# Patient Record
Sex: Female | Born: 1960
Health system: Southern US, Community
[De-identification: ages and names within clinical notes are randomized; demographics above are authoritative.]

## PROBLEM LIST (undated history)

## (undated) DIAGNOSIS — K5909 Other constipation: Secondary | ICD-10-CM

## (undated) DIAGNOSIS — J310 Chronic rhinitis: Secondary | ICD-10-CM

## (undated) DIAGNOSIS — Z8542 Personal history of malignant neoplasm of other parts of uterus: Secondary | ICD-10-CM

## (undated) DIAGNOSIS — Z90710 Acquired absence of both cervix and uterus: Secondary | ICD-10-CM

## (undated) DIAGNOSIS — D169 Benign neoplasm of bone and articular cartilage, unspecified: Secondary | ICD-10-CM

## (undated) DIAGNOSIS — J302 Other seasonal allergic rhinitis: Secondary | ICD-10-CM

## (undated) DIAGNOSIS — C55 Malignant neoplasm of uterus, part unspecified: Secondary | ICD-10-CM

## (undated) DIAGNOSIS — D229 Melanocytic nevi, unspecified: Secondary | ICD-10-CM

## (undated) DIAGNOSIS — H409 Unspecified glaucoma: Secondary | ICD-10-CM

## (undated) DIAGNOSIS — G809 Cerebral palsy, unspecified: Secondary | ICD-10-CM

## (undated) DIAGNOSIS — D509 Iron deficiency anemia, unspecified: Secondary | ICD-10-CM

## (undated) DIAGNOSIS — E8941 Symptomatic postprocedural ovarian failure: Secondary | ICD-10-CM

## (undated) DIAGNOSIS — R072 Precordial pain: Secondary | ICD-10-CM

## (undated) DIAGNOSIS — K5900 Constipation, unspecified: Secondary | ICD-10-CM

## (undated) DIAGNOSIS — R0789 Other chest pain: Secondary | ICD-10-CM

## (undated) HISTORY — DX: Iron deficiency anemia, unspecified: D50.9

## (undated) HISTORY — DX: Other chest pain: R07.89

## (undated) HISTORY — DX: Personal history of malignant neoplasm of other parts of uterus: Z85.42

## (undated) HISTORY — DX: Malignant neoplasm of uterus, part unspecified: C55

## (undated) HISTORY — DX: Melanocytic nevi, unspecified: D22.9

## (undated) HISTORY — PX: FOOT SURGERY: SHX648

## (undated) HISTORY — DX: Chronic rhinitis: J31.0

## (undated) HISTORY — PX: SHOULDER ARTHROSCOPY WITH OPEN ROTATOR CUFF REPAIR: SHX6092

## (undated) HISTORY — DX: Cerebral palsy, unspecified: G80.9

## (undated) HISTORY — DX: Symptomatic postprocedural ovarian failure: E89.41

## (undated) HISTORY — PX: TUBAL LIGATION: SHX77

## (undated) HISTORY — DX: Other constipation: K59.09

## (undated) HISTORY — DX: Acquired absence of both cervix and uterus: Z90.710

## (undated) HISTORY — PX: MYOMECTOMY: SHX85

## (undated) HISTORY — DX: Constipation, unspecified: K59.00

## (undated) HISTORY — PX: WISDOM TOOTH EXTRACTION: SHX21

## (undated) HISTORY — DX: Benign neoplasm of bone and articular cartilage, unspecified: D16.9

## (undated) HISTORY — PX: UMBILICAL HERNIA REPAIR: SHX196

## (undated) HISTORY — DX: Precordial pain: R07.2

## (undated) HISTORY — DX: Other seasonal allergic rhinitis: J30.2

---

## 1998-04-01 ENCOUNTER — Ambulatory Visit (HOSPITAL_COMMUNITY): Admission: RE | Admit: 1998-04-01 | Discharge: 1998-04-01 | Payer: Self-pay | Admitting: Obstetrics

## 1998-04-03 ENCOUNTER — Ambulatory Visit (HOSPITAL_COMMUNITY): Admission: RE | Admit: 1998-04-03 | Discharge: 1998-04-03 | Payer: Self-pay | Admitting: Obstetrics

## 1998-04-05 ENCOUNTER — Ambulatory Visit (HOSPITAL_COMMUNITY): Admission: RE | Admit: 1998-04-05 | Discharge: 1998-04-05 | Payer: Self-pay | Admitting: Obstetrics

## 1998-06-06 ENCOUNTER — Ambulatory Visit (HOSPITAL_COMMUNITY): Admission: RE | Admit: 1998-06-06 | Discharge: 1998-06-06 | Payer: Self-pay | Admitting: Obstetrics

## 1998-08-02 ENCOUNTER — Ambulatory Visit (HOSPITAL_COMMUNITY): Admission: RE | Admit: 1998-08-02 | Discharge: 1998-08-02 | Payer: Self-pay | Admitting: Obstetrics

## 1998-08-15 ENCOUNTER — Inpatient Hospital Stay (HOSPITAL_COMMUNITY): Admission: AD | Admit: 1998-08-15 | Discharge: 1998-08-15 | Payer: Self-pay | Admitting: Obstetrics

## 1998-08-15 ENCOUNTER — Encounter: Payer: Self-pay | Admitting: Obstetrics

## 1998-08-23 ENCOUNTER — Encounter: Payer: Self-pay | Admitting: Obstetrics

## 1998-08-23 ENCOUNTER — Inpatient Hospital Stay (HOSPITAL_COMMUNITY): Admission: AD | Admit: 1998-08-23 | Discharge: 1998-08-23 | Payer: Self-pay | Admitting: Obstetrics

## 1998-10-21 ENCOUNTER — Ambulatory Visit (HOSPITAL_COMMUNITY): Admission: RE | Admit: 1998-10-21 | Discharge: 1998-10-21 | Payer: Self-pay | Admitting: Obstetrics

## 1998-10-21 ENCOUNTER — Encounter: Payer: Self-pay | Admitting: Obstetrics

## 1998-11-21 ENCOUNTER — Inpatient Hospital Stay (HOSPITAL_COMMUNITY): Admission: AD | Admit: 1998-11-21 | Discharge: 1998-11-24 | Payer: Self-pay | Admitting: Obstetrics

## 2000-01-19 ENCOUNTER — Other Ambulatory Visit: Admission: RE | Admit: 2000-01-19 | Discharge: 2000-01-19 | Payer: Self-pay | Admitting: Obstetrics

## 2000-01-27 ENCOUNTER — Ambulatory Visit (HOSPITAL_COMMUNITY): Admission: RE | Admit: 2000-01-27 | Discharge: 2000-01-27 | Payer: Self-pay | Admitting: Obstetrics

## 2000-01-27 ENCOUNTER — Encounter: Payer: Self-pay | Admitting: Obstetrics

## 2002-03-29 ENCOUNTER — Encounter: Payer: Self-pay | Admitting: Obstetrics

## 2002-03-29 ENCOUNTER — Ambulatory Visit (HOSPITAL_COMMUNITY): Admission: RE | Admit: 2002-03-29 | Discharge: 2002-03-29 | Payer: Self-pay | Admitting: Obstetrics

## 2002-09-22 ENCOUNTER — Encounter (INDEPENDENT_AMBULATORY_CARE_PROVIDER_SITE_OTHER): Payer: Self-pay

## 2002-09-22 ENCOUNTER — Observation Stay (HOSPITAL_COMMUNITY): Admission: RE | Admit: 2002-09-22 | Discharge: 2002-09-23 | Payer: Self-pay | Admitting: Obstetrics

## 2003-02-19 ENCOUNTER — Encounter: Admission: RE | Admit: 2003-02-19 | Discharge: 2003-02-19 | Payer: Self-pay | Admitting: Family Medicine

## 2003-02-19 ENCOUNTER — Encounter: Payer: Self-pay | Admitting: Family Medicine

## 2003-02-28 ENCOUNTER — Encounter: Admission: RE | Admit: 2003-02-28 | Discharge: 2003-05-29 | Payer: Self-pay | Admitting: Family Medicine

## 2003-04-05 ENCOUNTER — Ambulatory Visit (HOSPITAL_COMMUNITY): Admission: RE | Admit: 2003-04-05 | Discharge: 2003-04-05 | Payer: Self-pay | Admitting: Obstetrics

## 2003-04-05 ENCOUNTER — Encounter: Payer: Self-pay | Admitting: Obstetrics

## 2004-01-07 ENCOUNTER — Other Ambulatory Visit: Admission: RE | Admit: 2004-01-07 | Discharge: 2004-01-07 | Payer: Self-pay | Admitting: Family Medicine

## 2004-04-08 ENCOUNTER — Ambulatory Visit (HOSPITAL_COMMUNITY): Admission: RE | Admit: 2004-04-08 | Discharge: 2004-04-08 | Payer: Self-pay | Admitting: Obstetrics

## 2005-01-20 ENCOUNTER — Ambulatory Visit: Payer: Self-pay | Admitting: Internal Medicine

## 2005-01-30 ENCOUNTER — Ambulatory Visit: Payer: Self-pay | Admitting: Internal Medicine

## 2005-04-20 ENCOUNTER — Ambulatory Visit (HOSPITAL_COMMUNITY): Admission: RE | Admit: 2005-04-20 | Discharge: 2005-04-20 | Payer: Self-pay | Admitting: Obstetrics

## 2005-12-30 ENCOUNTER — Ambulatory Visit: Payer: Self-pay | Admitting: Family Medicine

## 2006-02-26 ENCOUNTER — Ambulatory Visit: Payer: Self-pay | Admitting: Family Medicine

## 2006-03-29 ENCOUNTER — Ambulatory Visit: Payer: Self-pay | Admitting: Family Medicine

## 2006-04-21 ENCOUNTER — Ambulatory Visit (HOSPITAL_COMMUNITY): Admission: RE | Admit: 2006-04-21 | Discharge: 2006-04-21 | Payer: Self-pay | Admitting: Obstetrics

## 2006-05-25 ENCOUNTER — Ambulatory Visit: Payer: Self-pay | Admitting: Family Medicine

## 2006-07-08 ENCOUNTER — Ambulatory Visit: Payer: Self-pay | Admitting: Family Medicine

## 2006-07-13 DIAGNOSIS — J45909 Unspecified asthma, uncomplicated: Secondary | ICD-10-CM | POA: Insufficient documentation

## 2006-09-09 ENCOUNTER — Ambulatory Visit: Payer: Self-pay | Admitting: Family Medicine

## 2006-09-09 LAB — CONVERTED CEMR LAB
Total CHOL/HDL Ratio: 2.7
Triglycerides: 47 mg/dL (ref 0–149)
VLDL: 9 mg/dL (ref 0–40)

## 2006-10-05 ENCOUNTER — Ambulatory Visit: Payer: Self-pay | Admitting: Cardiovascular Disease

## 2006-10-26 ENCOUNTER — Ambulatory Visit: Payer: Self-pay

## 2007-01-04 ENCOUNTER — Ambulatory Visit: Payer: Self-pay | Admitting: Cardiovascular Disease

## 2007-04-04 ENCOUNTER — Ambulatory Visit: Payer: Self-pay | Admitting: Hematology & Oncology

## 2007-04-26 ENCOUNTER — Ambulatory Visit (HOSPITAL_COMMUNITY): Admission: RE | Admit: 2007-04-26 | Discharge: 2007-04-26 | Payer: Self-pay | Admitting: Obstetrics and Gynecology

## 2007-05-02 ENCOUNTER — Encounter (INDEPENDENT_AMBULATORY_CARE_PROVIDER_SITE_OTHER): Payer: Self-pay | Admitting: Family Medicine

## 2007-05-02 LAB — CHCC SMEAR

## 2007-05-02 LAB — CBC WITH DIFFERENTIAL/PLATELET
Basophils Absolute: 0.1 10*3/uL (ref 0.0–0.1)
EOS%: 3.6 % (ref 0.0–7.0)
Eosinophils Absolute: 0.2 10*3/uL (ref 0.0–0.5)
LYMPH%: 46.6 % (ref 14.0–48.0)
MCH: 23.5 pg — ABNORMAL LOW (ref 26.0–34.0)
MCV: 74.4 fL — ABNORMAL LOW (ref 81.0–101.0)
MONO%: 6.5 % (ref 0.0–13.0)
NEUT#: 2 10*3/uL (ref 1.5–6.5)
Platelets: 305 10*3/uL (ref 145–400)
RBC: 5.11 10*6/uL (ref 3.70–5.32)

## 2007-06-08 ENCOUNTER — Ambulatory Visit: Payer: Self-pay | Admitting: Hematology & Oncology

## 2007-06-10 ENCOUNTER — Encounter (INDEPENDENT_AMBULATORY_CARE_PROVIDER_SITE_OTHER): Payer: Self-pay | Admitting: Family Medicine

## 2007-06-10 LAB — CBC & DIFF AND RETIC
Eosinophils Absolute: 0.1 10*3/uL (ref 0.0–0.5)
HCT: 37.8 % (ref 34.8–46.6)
LYMPH%: 34.1 % (ref 14.0–48.0)
MONO#: 0.5 10*3/uL (ref 0.1–0.9)
NEUT#: 2.9 10*3/uL (ref 1.5–6.5)
NEUT%: 54 % (ref 39.6–76.8)
Platelets: 222 10*3/uL (ref 145–400)
WBC: 5.4 10*3/uL (ref 3.9–10.0)

## 2007-06-10 LAB — FERRITIN: Ferritin: 340 ng/mL — ABNORMAL HIGH (ref 10–291)

## 2007-09-25 ENCOUNTER — Emergency Department (HOSPITAL_COMMUNITY): Admission: EM | Admit: 2007-09-25 | Discharge: 2007-09-25 | Payer: Self-pay | Admitting: Family Medicine

## 2008-04-26 ENCOUNTER — Ambulatory Visit (HOSPITAL_COMMUNITY): Admission: RE | Admit: 2008-04-26 | Discharge: 2008-04-26 | Payer: Self-pay | Admitting: Obstetrics and Gynecology

## 2008-10-10 ENCOUNTER — Ambulatory Visit: Payer: Self-pay | Admitting: Family Medicine

## 2008-10-10 DIAGNOSIS — D169 Benign neoplasm of bone and articular cartilage, unspecified: Secondary | ICD-10-CM | POA: Insufficient documentation

## 2008-10-10 DIAGNOSIS — Z8719 Personal history of other diseases of the digestive system: Secondary | ICD-10-CM

## 2008-10-10 DIAGNOSIS — D239 Other benign neoplasm of skin, unspecified: Secondary | ICD-10-CM | POA: Insufficient documentation

## 2008-10-10 DIAGNOSIS — J309 Allergic rhinitis, unspecified: Secondary | ICD-10-CM | POA: Insufficient documentation

## 2008-10-10 HISTORY — DX: Benign neoplasm of bone and articular cartilage, unspecified: D16.9

## 2008-10-12 ENCOUNTER — Telehealth (INDEPENDENT_AMBULATORY_CARE_PROVIDER_SITE_OTHER): Payer: Self-pay | Admitting: *Deleted

## 2008-10-12 LAB — CONVERTED CEMR LAB
BUN: 23 mg/dL (ref 6–23)
Basophils Relative: 0.5 % (ref 0.0–3.0)
Bilirubin, Direct: 0.1 mg/dL (ref 0.0–0.3)
CO2: 28 meq/L (ref 19–32)
Chloride: 107 meq/L (ref 96–112)
Cholesterol: 219 mg/dL — ABNORMAL HIGH (ref 0–200)
Creatinine, Ser: 1.1 mg/dL (ref 0.4–1.2)
Direct LDL: 137.9 mg/dL
Eosinophils Absolute: 0.1 10*3/uL (ref 0.0–0.7)
HCT: 44.7 % (ref 36.0–46.0)
Lymphs Abs: 2.1 10*3/uL (ref 0.7–4.0)
MCHC: 33.3 g/dL (ref 30.0–36.0)
MCV: 88.3 fL (ref 78.0–100.0)
Monocytes Absolute: 0.5 10*3/uL (ref 0.1–1.0)
Neutrophils Relative %: 49.3 % (ref 43.0–77.0)
Platelets: 218 10*3/uL (ref 150.0–400.0)
RBC: 5.06 M/uL (ref 3.87–5.11)
TSH: 1.86 microintl units/mL (ref 0.35–5.50)
Total Protein: 7.8 g/dL (ref 6.0–8.3)

## 2008-10-24 DIAGNOSIS — Z9189 Other specified personal risk factors, not elsewhere classified: Secondary | ICD-10-CM

## 2008-10-26 ENCOUNTER — Ambulatory Visit: Payer: Self-pay | Admitting: Cardiovascular Disease

## 2008-10-26 DIAGNOSIS — R072 Precordial pain: Secondary | ICD-10-CM

## 2008-10-26 DIAGNOSIS — R9430 Abnormal result of cardiovascular function study, unspecified: Secondary | ICD-10-CM | POA: Insufficient documentation

## 2008-10-26 HISTORY — DX: Precordial pain: R07.2

## 2008-11-06 ENCOUNTER — Ambulatory Visit (HOSPITAL_COMMUNITY): Admission: RE | Admit: 2008-11-06 | Discharge: 2008-11-06 | Payer: Self-pay | Admitting: Cardiovascular Disease

## 2008-11-06 ENCOUNTER — Encounter: Payer: Self-pay | Admitting: Cardiovascular Disease

## 2008-11-06 ENCOUNTER — Ambulatory Visit: Payer: Self-pay | Admitting: Cardiovascular Disease

## 2008-11-26 ENCOUNTER — Encounter: Payer: Self-pay | Admitting: Cardiovascular Disease

## 2009-01-30 ENCOUNTER — Ambulatory Visit: Payer: Self-pay | Admitting: Family Medicine

## 2009-01-30 DIAGNOSIS — M542 Cervicalgia: Secondary | ICD-10-CM | POA: Insufficient documentation

## 2009-01-31 ENCOUNTER — Ambulatory Visit: Payer: Self-pay | Admitting: Family Medicine

## 2009-02-05 ENCOUNTER — Telehealth (INDEPENDENT_AMBULATORY_CARE_PROVIDER_SITE_OTHER): Payer: Self-pay | Admitting: *Deleted

## 2009-03-04 ENCOUNTER — Ambulatory Visit: Payer: Self-pay | Admitting: Internal Medicine

## 2009-03-12 ENCOUNTER — Ambulatory Visit: Payer: Self-pay | Admitting: Internal Medicine

## 2009-04-11 ENCOUNTER — Encounter (INDEPENDENT_AMBULATORY_CARE_PROVIDER_SITE_OTHER): Payer: Self-pay | Admitting: *Deleted

## 2009-04-11 ENCOUNTER — Ambulatory Visit: Payer: Self-pay | Admitting: Family Medicine

## 2009-04-29 ENCOUNTER — Ambulatory Visit: Payer: Self-pay | Admitting: Family Medicine

## 2009-04-30 ENCOUNTER — Telehealth (INDEPENDENT_AMBULATORY_CARE_PROVIDER_SITE_OTHER): Payer: Self-pay | Admitting: *Deleted

## 2009-04-30 LAB — CONVERTED CEMR LAB
AST: 14 units/L (ref 0–37)
Cholesterol: 197 mg/dL (ref 0–200)
HDL: 52.6 mg/dL (ref >39.00)
LDL Cholesterol: 134 mg/dL — ABNORMAL HIGH (ref 0–99)
Total Bilirubin: 0.7 mg/dL (ref 0.3–1.2)
Triglycerides: 52 mg/dL (ref 0.0–149.0)
VLDL: 10.4 mg/dL (ref 0.0–40.0)

## 2009-05-07 ENCOUNTER — Ambulatory Visit (HOSPITAL_COMMUNITY): Admission: RE | Admit: 2009-05-07 | Discharge: 2009-05-07 | Payer: Self-pay | Admitting: Obstetrics and Gynecology

## 2009-05-14 ENCOUNTER — Encounter: Admission: RE | Admit: 2009-05-14 | Discharge: 2009-05-14 | Payer: Self-pay | Admitting: Obstetrics and Gynecology

## 2009-06-22 DIAGNOSIS — C541 Malignant neoplasm of endometrium: Secondary | ICD-10-CM

## 2009-06-22 DIAGNOSIS — Z90710 Acquired absence of both cervix and uterus: Secondary | ICD-10-CM

## 2009-06-22 DIAGNOSIS — C55 Malignant neoplasm of uterus, part unspecified: Secondary | ICD-10-CM

## 2009-06-22 HISTORY — DX: Malignant neoplasm of uterus, part unspecified: C55

## 2009-06-22 HISTORY — PX: TOTAL VAGINAL HYSTERECTOMY: SHX2548

## 2009-06-22 HISTORY — DX: Malignant neoplasm of endometrium: C54.1

## 2009-06-22 HISTORY — DX: Acquired absence of both cervix and uterus: Z90.710

## 2009-08-20 LAB — CONVERTED CEMR LAB: Pap Smear: NORMAL

## 2010-01-06 ENCOUNTER — Ambulatory Visit: Payer: Self-pay | Admitting: Internal Medicine

## 2010-01-06 DIAGNOSIS — Z8542 Personal history of malignant neoplasm of other parts of uterus: Secondary | ICD-10-CM

## 2010-01-06 DIAGNOSIS — E8941 Symptomatic postprocedural ovarian failure: Secondary | ICD-10-CM

## 2010-01-06 HISTORY — DX: Personal history of malignant neoplasm of other parts of uterus: Z85.42

## 2010-01-06 HISTORY — DX: Symptomatic postprocedural ovarian failure: E89.41

## 2010-01-14 ENCOUNTER — Ambulatory Visit: Payer: Self-pay | Admitting: Internal Medicine

## 2010-01-14 LAB — CONVERTED CEMR LAB
AST: 20 units/L (ref 0–37)
Albumin: 4.3 g/dL (ref 3.5–5.2)
Alkaline Phosphatase: 66 units/L (ref 39–117)
Basophils Absolute: 0 10*3/uL (ref 0.0–0.1)
Basophils Relative: 0.3 % (ref 0.0–3.0)
Bilirubin, Direct: 0.1 mg/dL (ref 0.0–0.3)
Calcium: 9.7 mg/dL (ref 8.4–10.5)
Creatinine, Ser: 1.1 mg/dL (ref 0.4–1.2)
Eosinophils Absolute: 0.1 10*3/uL (ref 0.0–0.7)
GFR calc non Af Amer: 66.46 mL/min (ref >60)
HDL: 48 mg/dL (ref >39.00)
Hemoglobin: 13.5 g/dL (ref 12.0–15.0)
Ketones, urine, test strip: NEGATIVE
LDL Cholesterol: 108 mg/dL — ABNORMAL HIGH (ref 0–99)
Lymphocytes Relative: 44.4 % (ref 12.0–46.0)
MCHC: 32.8 g/dL (ref 30.0–36.0)
Monocytes Relative: 8.1 % (ref 3.0–12.0)
Neutro Abs: 2.2 10*3/uL (ref 1.4–7.7)
Neutrophils Relative %: 45.6 % (ref 43.0–77.0)
Nitrite: NEGATIVE
Protein, U semiquant: NEGATIVE
RBC: 4.85 M/uL (ref 3.87–5.11)
RDW: 14.7 % — ABNORMAL HIGH (ref 11.5–14.6)
Sodium: 140 meq/L (ref 135–145)
Total CHOL/HDL Ratio: 4
Triglycerides: 64 mg/dL (ref 0.0–149.0)
Urobilinogen, UA: 0.2
VLDL: 12.8 mg/dL (ref 0.0–40.0)
WBC Urine, dipstick: NEGATIVE

## 2010-01-17 ENCOUNTER — Encounter: Payer: Self-pay | Admitting: Internal Medicine

## 2010-01-21 ENCOUNTER — Ambulatory Visit: Payer: Self-pay | Admitting: Internal Medicine

## 2010-01-21 DIAGNOSIS — H40009 Preglaucoma, unspecified, unspecified eye: Secondary | ICD-10-CM | POA: Insufficient documentation

## 2010-05-19 ENCOUNTER — Encounter: Admission: RE | Admit: 2010-05-19 | Discharge: 2010-05-19 | Payer: Self-pay | Admitting: Obstetrics and Gynecology

## 2010-06-19 ENCOUNTER — Ambulatory Visit
Admission: RE | Admit: 2010-06-19 | Discharge: 2010-06-19 | Payer: Self-pay | Source: Home / Self Care | Attending: Internal Medicine | Admitting: Internal Medicine

## 2010-06-19 ENCOUNTER — Encounter: Payer: Self-pay | Admitting: Internal Medicine

## 2010-06-19 DIAGNOSIS — N760 Acute vaginitis: Secondary | ICD-10-CM | POA: Insufficient documentation

## 2010-06-19 DIAGNOSIS — L609 Nail disorder, unspecified: Secondary | ICD-10-CM | POA: Insufficient documentation

## 2010-06-19 DIAGNOSIS — Z90710 Acquired absence of both cervix and uterus: Secondary | ICD-10-CM

## 2010-06-19 DIAGNOSIS — R82998 Other abnormal findings in urine: Secondary | ICD-10-CM

## 2010-06-19 LAB — CONVERTED CEMR LAB
Bilirubin Urine: NEGATIVE
Glucose, Urine, Semiquant: NEGATIVE
Protein, U semiquant: NEGATIVE
Urobilinogen, UA: 0.2
pH: 6.5

## 2010-06-20 ENCOUNTER — Encounter: Payer: Self-pay | Admitting: Internal Medicine

## 2010-06-24 LAB — CONVERTED CEMR LAB
Basophils Relative: 0.3 % (ref 0.0–3.0)
Eosinophils Relative: 1.8 % (ref 0.0–5.0)
Folate: 13.1 ng/mL
Free T4: 0.69 ng/dL (ref 0.60–1.60)
HCT: 43.7 % (ref 36.0–46.0)
Hemoglobin: 14.5 g/dL (ref 12.0–15.0)
Iron: 73 ug/dL (ref 42–145)
Lymphs Abs: 2 10*3/uL (ref 0.7–4.0)
MCV: 86.3 fL (ref 78.0–100.0)
Monocytes Absolute: 0.4 10*3/uL (ref 0.1–1.0)
Monocytes Relative: 6.1 % (ref 3.0–12.0)
Neutro Abs: 3.5 10*3/uL (ref 1.4–7.7)
RBC: 5.07 M/uL (ref 3.87–5.11)
Sed Rate: 15 mm/hr (ref 0–22)
TSH: 1.28 microintl units/mL (ref 0.35–5.50)
Vitamin B-12: 595 pg/mL (ref 211–911)
WBC: 6 10*3/uL (ref 4.5–10.5)

## 2010-07-13 ENCOUNTER — Encounter: Payer: Self-pay | Admitting: Obstetrics and Gynecology

## 2010-07-22 NOTE — Assessment & Plan Note (Signed)
Summary: cpx/no pap/ok per doc./njr   Vital Signs:  Patient profile:   50 year old female Menstrual status:  hysterectomy Height:      67.5 inches Weight:      187 pounds Pulse rate:   60 / minute BP sitting:   110 / 80  (left arm) Cuff size:   regular  Vitals Entered By: Romualdo Bolk, CMA (AAMA) (January 21, 2010 11:24 AM) CC: CPX-Hyst   History of Present Illness: Hannah [name]  comes in today  for preventive visit.  Since last visit  here  there have been no major changes in health status  . she has been eating healthier and trying to walk and exercise more and feels good  and more energy.   Had gyne check that was ok .  Asthma quiescent used claritin recetly with help . NO cough   Preventive Care Screening  Prior Values:    Pap Smear:  normal (08/20/2009)    Mammogram:  normal (04/22/2009)    Colonoscopy:  Location:  [facility].   (03/12/2009)    Last Tetanus Booster:  Historical (06/22/2004)   Preventive Screening-Counseling & Management  Alcohol-Tobacco     Alcohol drinks/day: 0     Smoking Status: never     Passive Smoke Exposure: yes  Caffeine-Diet-Exercise     Caffeine use/day: 1     Does Patient Exercise: yes     Times/week: 2     Exercise Counseling: to improve exercise regimen  Hep-HIV-STD-Contraception     Dental Visit-last 6 months yes  Safety-Violence-Falls     Seat Belt Use: yes     Firearms in the Home: firearms in the home     Firearm Counseling: not indicated; uses recommended firearm safety measures     Smoke Detectors: yes  Current Medications (verified): 1)  Multivitamins  Tabs (Multiple Vitamin) .... Three Times A Day 2)  Ventolin Hfa 108 (90 Base) Mcg/act Aers (Albuterol Sulfate) .... 2 Puffs Every 4 Hours As Needed For Cough or Wheezing 3)  Fish Oil Triple Strength 1360 Mg Caps (Omega-3 Fatty Acids) 4)  Be Beautiful- Hair Skin Nails 5)  Probiotic  Caps (Probiotic Product) 6)  Travatan Z 0.004 % Soln  (Travoprost)  Allergies (verified): No Known Drug Allergies  Past History:  Past medical, surgical, family and social histories (including risk factors) reviewed, and no changes noted (except as noted below).  Past Medical History: Current Problems:  CHEST PAIN, ATYPICAL, HX OF (ICD-V15.89) RHINITIS (ICD-477.9)  allergic NEVUS, ATYPICAL (ICD-216.9) CONSTIPATION, CHRONIC, HX OF (ICD-V12.79) OSTEOCHONDROMA (ICD-213.9) ASTHMA (ICD-493.90) as a child  Uterine cancer   hysterectomy  2011  presesnted with irreg bleeding.  G3 P3   one csection. Elevated IOP  Last td 2006  Past Surgical History: Reviewed history from 01/06/2010 and no changes required.  caesarean section, hernia repair- foot surgery  total hysterctomy 2011  Past History:  Care Management: Gynecology: Dr. Campbell Riches. Dermatology: Danella Deis Orthopedics: Posey Rea of name Opthal:    Dr Lucretia Roers  Family History: Reviewed history from 01/06/2010 and no changes required. The patient's mother died at age 21.  She had  hypertension and heavy alcohol use.    He  has had a silent MI in the past, and also has hypertension, diabetes,  and dyslipidemia.  She has a 39 year old sister who has hypertension. father:    died complications of  DM, amputation and sepsis in his 56s   family hx  of colon  polyps.   Social History: Reviewed history from 01/06/2010 and no changes required.   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  She does not smoke cigarettes or use recreational  drugs.  She drinks alcohol on rare occasions with 1 glass of wine  approximately once a month.  She does not do regular exercise.   HH of 5 3 children   pet poodle  Husband smoker   [name] is married with 3 children, ages 55, 54, and 60.  Review of Systems       ROS  12  negative  or no change  gets irritated area in  intergluteal area  off and on poss from wiping  .   Physical Exam  General:  alert, well-developed, well-nourished, and well-hydrated.   in nad  Head:  normocephalic, atraumatic, and no abnormalities observed.   Eyes:  vision grossly intact, pupils equal, pupils round, and pupils reactive to light.   eoms nl  Ears:  R ear normal, L ear normal, and no external deformities.   Nose:  no external deformity, no external erythema, and no nasal discharge.   Mouth:  good dentition and pharynx pink and moist.   Neck:  No deformities, masses, or tenderness noted. Chest Wall:  No deformities, masses, or tenderness noted. Breasts:  No mass, nodules, thickening, tenderness, bulging, retraction, inflamation, nipple discharge or skin changes noted.   Lungs:  Normal respiratory effort, chest expands symmetrically. Lungs are clear to auscultation, no crackles or wheezes.no dullness.   Heart:  Normal rate and regular rhythm. S1 and S2 normal without gallop, murmur, click, rub or other extra sounds.no lifts.   Abdomen:  Bowel sounds positive,abdomen soft and non-tender without masses, organomegaly or hernias noted. Msk:  no joint swelling, no joint warmth, no redness over joints, and no joint deformities.   Pulses:  R and L carotid,radial,femoral,dorsalis pedis and posterior tibial pulses are full and equal bilaterally Extremities:  No clubbing, cyanosis, edema, or deformity noted with normal full range of motion of all joints.   healed scar on mtps  Neurologic:  alert & oriented X3 and cranial nerves IIi-XII intact.  alert & oriented X3, cranial nerves II-XII intact, strength normal in all extremities, gait normal, and DTRs symmetrical and normal.   Skin:  turgor normal and color normal.   intergluteal red abraded spot right  no blisters or pustules   Cervical Nodes:  No lymphadenopathy noted Inguinal Nodes:  No significant adenopathy Psych:  Oriented X3, normally interactive, good eye contact, not anxious  appearing, and not depressed appearing.     Impression & Recommendations:  Problem # 1:  PREVENTIVE HEALTH CARE (ICD-V70.0) Discussed nutrition,exercise,diet,healthy weight, vitamin D and calcium.       lipids are much better than in the past    . continue  exercise and weight loss  . reviewed labs and old labs with patient.  Problem # 2:  MENOPAUSE, SURGICAL (ICD-627.4) Assessment: Comment Only  Problem # 3:  ASTHMA (ICD-493.90) quiescent    but give pneumovax today Her updated medication list for this problem includes:    Ventolin Hfa 108 (90 Base) Mcg/act Aers (Albuterol sulfate) .Marland Kitchen... 2 puffs every 4 hours as needed for cough or wheezing  Problem # 4:  NEOPLASM, MALIGNANT, UTERUS, HX OF (ICD-V10.42) recent gyne exam and pap was normal .  Problem # 5:  INCREASED INTRAOCULAR PRESSURE (ICD-365.00) under rx and good.   Complete Medication List: 1)  Multivitamins Tabs (Multiple vitamin) .... Three times a day 2)  Ventolin Hfa 108 (90 Base) Mcg/act Aers (Albuterol sulfate) .... 2 puffs every 4 hours as needed for cough or wheezing 3)  Fish Oil Triple Strength 1360 Mg Caps (Omega-3 fatty acids) 4)  Be Beautiful- Hair Skin Nails  5)  Probiotic Caps (Probiotic product) 6)  Travatan Z 0.004 % Soln (Travoprost)  Other Orders: Pneumococcal Vaccine (28413) Admin 1st Vaccine (24401)  Patient Instructions: 1)  continue  healthy lifestyle intervention  to help with your lipids  and health . weight loss  to BMI to 25 is a goal. 2)  your td was in 2006 . 3)  check yearly with lipid check.    Immunizations Administered:  Pneumonia Vaccine:    Vaccine Type: Pneumovax    Site: right deltoid    Mfr: Merck    Dose: 0.5 ml    Route: IM    Given by: Romualdo Bolk, CMA (AAMA)    Exp. Date: 06/21/2011    Lot #: [id number]

## 2010-07-22 NOTE — Assessment & Plan Note (Signed)
Summary: NEW PT EST // RS/PT RSC/CJR rsc ok per doc/njr   Vital Signs:  Patient profile:   50 year old female Menstrual status:  hysterectomy Height:      69 inches Weight:      196 pounds BMI:     29.05 Pulse rate:   78 / minute BP sitting:   132 / 80  (left arm) Cuff size:   regular  Vitals Entered By: Romualdo Bolk, CMA (AAMA) (January 06, 2010 1:55 PM) CC: New Pt to establish-Discuss vitamins and recent hysterectomy     Menstrual Status hysterectomy Last PAP Result normal   History of Present Illness: Elianys [name] comes in today  for new patient  visit .Her children come to this office .   Anemia :  with bleeding not now,  better  after surgery  hysterectomy 2011 .  She has some ? about this  . THere was  a dx of endometrial cancer  but  hysterectomy " got it all" however this  has been difficult for her .   Her irreg bleeding had been there for a whil and the discovery of above was  alarming to her.  Arthma as a child and had allergy shots  .    When gets a cold   used OTc and  does ok.   and inhaler in the past  LIPIDS have been elevated ::    No meds needed.    lifestyle intervention .   Preventive Care Screening  Pap Smear:    Date:  08/20/2009    Results:  normal   Mammogram:    Date:  04/22/2009    Results:  normal   Last Tetanus Booster:    Date:  06/22/2004    Results:  Historical    Preventive Screening-Counseling & Management  Alcohol-Tobacco     Alcohol drinks/day: 0     Smoking Status: never     Passive Smoke Exposure: yes  Caffeine-Diet-Exercise     Caffeine use/day: 1     Does Patient Exercise: yes     Times/week: 2  Hep-HIV-STD-Contraception     Dental Visit-last 6 months yes  Safety-Violence-Falls     Seat Belt Use: yes     Firearms in the Home: firearms in the home     Firearm Counseling: not indicated; uses recommended firearm safety measures     Smoke Detectors: yes      Blood Transfusions:  no.    Current  Medications (verified): 1)  Multivitamins  Tabs (Multiple Vitamin) .... Three Times A Day 2)  Ventolin Hfa 108 (90 Base) Mcg/act Aers (Albuterol Sulfate) .... 2 Puffs Every 4 Hours As Needed For Cough or Wheezing 3)  Fish Oil Triple Strength 1360 Mg Caps (Omega-3 Fatty Acids) 4)  Be Beautiful- Hair Skin Nails 5)  Probiotic  Caps (Probiotic Product) 6)  Travatan Z 0.004 % Soln (Travoprost)  Allergies (verified): No Known Drug Allergies  Past History:  Past Medical History: Current Problems:  CHEST PAIN, ATYPICAL, HX OF (ICD-V15.89) RHINITIS (ICD-477.9)  allergic NEVUS, ATYPICAL (ICD-216.9) CONSTIPATION, CHRONIC, HX OF (ICD-V12.79) OSTEOCHONDROMA (ICD-213.9) ASTHMA (ICD-493.90) as a child  Uterine cancer   hysterectomy  2011  presesnted with irreg bleeding.   G3 P3   one csection. Elevated IOP   Past Surgical History:  caesarean section, hernia repair- foot surgery  total hysterctomy 2011  Past History:  Care Management: Gynecology: Dr. Campbell Riches. Dermatology: Danella Deis Orthopedics: Posey Rea of name Opthal:  Dr Lucretia Roers  Family History: The patient's mother died at age 84.  She had  hypertension and heavy alcohol use.    He  has had a silent MI in the past, and also has hypertension, diabetes,  and dyslipidemia.  She has a 45 year old sister who has hypertension. father:    died complications of  DM, amputation and sepsis in his 97s   family hx  of colon polyps.   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  She does not smoke cigarettes or use recreational  drugs.  She drinks alcohol on rare occasions with 1 glass of wine  approximately once a month.  She does not do regular exercise.   HH of 5 3 children   pet poodle  Husband smoker   [name] is married with 3 children, ages 31, 33, and 45.  Caffeine use/day:  1 Does  Patient Exercise:  yes Passive Smoke Exposure:  yes Seat Belt Use:  yes Dental Care w/in 6 mos.:  yes Blood Transfusions:  no  Review of Systems  The patient denies anorexia, fever, weight loss, weight gain, vision loss, decreased hearing, prolonged cough, abdominal pain, melena, hematochezia, severe indigestion/heartburn, hematuria, muscle weakness, transient blindness, difficulty walking, abnormal bleeding, enlarged lymph nodes, and breast masses.         stressed and emotional at times   Physical Exam  General:  alert, well-developed, well-nourished, and well-hydrated.   Head:  normocephalic, atraumatic, and no abnormalities observed.   Eyes:  vision grossly intact, pupils equal, pupils round, and pupils reactive to light.   Ears:  R ear normal, L ear normal, and no external deformities.   Neck:  No deformities, masses, or tenderness noted. Lungs:  Normal respiratory effort, chest expands symmetrically. Lungs are clear to auscultation, no crackles or wheezes.no dullness.   Heart:  Normal rate and regular rhythm. S1 and S2 normal without gallop, murmur, click, rub or other extra sounds.no gallop.   Abdomen:  Bowel sounds positive,abdomen soft and non-tender without masses, organomegaly or hernias noted.   Pulses:  pulses intact without delay   Extremities:  no clubbing cyanosis or edema  Neurologic:  non focal  Skin:  turgor normal, color normal, no ecchymoses, and no petechiae.   Cervical Nodes:  No lymphadenopathy noted Psych:  Oriented X3, normally interactive, good eye contact, not anxious appearing, and not depressed appearing.      Impression & Recommendations:  Problem # 1:  NEOPLASM, MALIGNANT, UTERUS, HX OF (ICD-V10.42) surgery curative by hx   followed by gyne   Problem # 2:  MENOPAUSE, SURGICAL (ICD-627.4) by hx not that many signs of such although some of her moodiness could be  but nl for anyone going through the above dx.   Expectant management of signs  and to  discuss with gyne .   Problem # 3:  ASTHMA (ICD-493.90) quiescent  Her updated medication list for this problem includes:    Ventolin Hfa 108 (90 Base) Mcg/act Aers (Albuterol sulfate) .Marland Kitchen... 2 puffs every 4 hours as needed for cough or wheezing  Problem # 4:  counseling disc vitamins and  nutrition lifestyle  using fish oil and  probiotics and vits without iron.   Complete Medication List: 1)  Multivitamins Tabs (Multiple vitamin) .... Three times a day 2)  Ventolin Hfa 108 (90 Base) Mcg/act  Aers (Albuterol sulfate) .... 2 puffs every 4 hours as needed for cough or wheezing 3)  Fish Oil Triple Strength 1360 Mg Caps (Omega-3 fatty acids) 4)  Be Beautiful- Hair Skin Nails  5)  Probiotic Caps (Probiotic product) 6)  Travatan Z 0.004 % Soln (Travoprost)  Patient Instructions: 1)  cpx with labs    in August .   2011 . ( can make a slot     not on a thursday or Friday pm)   Hg a1c .   also for family hx of diabetes.  greater than 50% of visit spent in counseling

## 2010-07-22 NOTE — Letter (Signed)
Summary: External Other  External Other   Imported By: Everrett Coombe 01/17/2010 09:01:33  _____________________________________________________________________  External Attachment:    Type:   Image     Comment:   External Document

## 2010-07-24 NOTE — Assessment & Plan Note (Signed)
Summary: UTI??  OK PER SHANNON//SLM   Vital Signs:  Patient profile:   50 year old female Menstrual status:  hysterectomy Weight:      188 pounds Temp:     98.9 degrees F oral Pulse rate:   72 / minute BP sitting:   100 / 70  (left arm) Cuff size:   regular  Vitals Entered By: Hannah [name], CMA (AAMA) (June 19, 2010 9:31 AM) CC: vaginal discharge that is white, slight itching. This started a few weeks ago. Pt has also noticed a change in her nails and thinks that she is missing a vitamin.   History of Present Illness: Hannah [name] comes in today  for acute  visit sda abecause of the above problem  SHe has had a white vaginal discharge for about a week or 2 and then go tingling with Urination .   Has had a HYsterectoomy total and hadnt had a dc since then .norecent antibioitcs.  Some stresss and feels like a change of nails and hair with brittleness and ridging. No new joint pains  fevers other rashes.  No hot flushes  abd pain or flank pain.  Preventive Screening-Counseling & Management  Alcohol-Tobacco     Alcohol drinks/day: 0     Smoking Status: never     Passive Smoke Exposure: yes  Caffeine-Diet-Exercise     Caffeine use/day: 1     Does Patient Exercise: yes     Times/week: 2     Exercise Counseling: to improve exercise regimen  Current Medications (verified): 1)  Multivitamins  Tabs (Multiple Vitamin) .... Three Times A Day 2)  Ventolin Hfa 108 (90 Base) Mcg/act Aers (Albuterol Sulfate) .... 2 Puffs Every 4 Hours As Needed For Cough or Wheezing 3)  Fish Oil Triple Strength 1360 Mg Caps (Omega-3 Fatty Acids) 4)  Biotin 5 Mg Caps (Biotin) 5)  Probiotic  Caps (Probiotic Product) 6)  Travatan Z 0.004 % Soln (Travoprost)  Allergies (verified): No Known Drug Allergies  Past History:  Past medical, surgical, family and social histories (including risk factors) reviewed, and no changes noted (except as noted below).  Past Medical History: Reviewed  history from 01/21/2010 and no changes required. Current Problems:  CHEST PAIN, ATYPICAL, HX OF (ICD-V15.89) RHINITIS (ICD-477.9)  allergic NEVUS, ATYPICAL (ICD-216.9) CONSTIPATION, CHRONIC, HX OF (ICD-V12.79) OSTEOCHONDROMA (ICD-213.9) ASTHMA (ICD-493.90) as a child  Uterine cancer   hysterectomy  2011  presesnted with irreg bleeding.  G3 P3   one csection. Elevated IOP  Last td 2006  Past Surgical History: Reviewed history from 01/06/2010 and no changes required.  caesarean section, hernia repair- foot surgery  total hysterctomy 2011  Past History:  Care Management: Gynecology: Dr. Campbell Riches. Dermatology: Danella Deis Orthopedics: Posey Rea of name Opthal:    Dr Lucretia Roers  Family History: Reviewed history from 01/06/2010 and no changes required. The patient's mother died at age 32.  She had  hypertension and heavy alcohol use.    He  has had a silent MI in the past, and also has hypertension, diabetes,  and dyslipidemia.  She has a 31 year old sister who has hypertension. father:    died complications of  DM, amputation and sepsis in his 72s   family hx  of colon polyps.   Social History: Reviewed history from 01/06/2010 and no changes required.   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  She does not smoke cigarettes or use recreational  drugs.  She drinks alcohol on rare occasions with 1 glass of wine  approximately once a month.  She does not do regular exercise.   HH of 5 3 children   pet poodle  Husband smoker   [name] is married with 3 children, ages 84, 8, and 12.  Review of Systems  The patient denies anorexia, fever, weight loss, weight gain, abdominal pain, hematochezia, hematuria, incontinence, genital sores, difficulty walking, enlarged lymph nodes, and angioedema.    Physical Exam  General:  Well-developed,well-nourished,in no acute  distress; alert,appropriate and cooperative throughout examination Head:  normocephalic and atraumatic.   Neck:  No deformities, masses, or tenderness noted. Lungs:  normal respiratory effort, no intercostal retractions, and no accessory muscle use.   Heart:  normal rate, regular rhythm, and no murmur.   Abdomen:  Bowel sounds positive,abdomen soft and non-tender without masses, organomegaly or   noted. Genitalia:  normal introitus and no external lesions.  cx absent vag 2+ red creamy  white grey yellow discharge noted   no lesions. Pulses:  nl cap refill  Skin:  turgor normal, no ecchymoses, and no petechiae.  nails sliglthy brittele  some ridging  but no pitting or   onycholysis ...hair thinning  Cervical Nodes:  No lymphadenopathy noted Psych:  Oriented X3, not anxious appearing, and not depressed appearing.     Impression & Recommendations:  Problem # 1:  VAGINITIS (ICD-616.10) probably BV   by exam and context   will rx  and if persistent or  progressive  can see gyne or do lab testing .  Her updated medication list for this problem includes:    Metronidazole 500 Mg Tabs (Metronidazole) .Marland Kitchen... 1 by mouth two times a day  Problem # 2:  NAIL DISORDER (ICD-703.9) seems like  age changing poss from surgical menopause but would rechec labs based on her  newer onset symptoms  Orders: TLB-CBC Platelet - w/Differential (85025-CBCD) TLB-IBC Pnl (Iron/FE;Transferrin) (83550-IBC) TLB-B12 + Folate Pnl (16109_60454-U98/JXB) TLB-TSH (Thyroid Stimulating Hormone) (84443-TSH) TLB-T4 (Thyrox), Free [phone]) T-Antinuclear Antib (ANA) [phone]) T-Vitamin D (25-Hydroxy) [phone]) TLB-Sedimentation Rate (ESR) (85652-ESR)  Problem # 3:  ACQUIRED ABSENCE OF BOTH CERVIX AND UTERUS (ICD-V88.01) ? if surgical menopause  Problem # 4:  URINALYSIS, ABNORMAL (ICD-791.9)  Orders: T-Culture, Urine (24401-02725)  Complete Medication List: 1)  Multivitamins Tabs (Multiple vitamin) .... Three  times a day 2)  Ventolin Hfa 108 (90 Base) Mcg/act Aers (Albuterol sulfate) .... 2 puffs every 4 hours as needed for cough or wheezing 3)  Fish Oil Triple Strength 1360 Mg Caps (Omega-3 fatty acids) 4)  Biotin 5 Mg Caps (Biotin) 5)  Probiotic Caps (Probiotic product) 6)  Travatan Z 0.004 % Soln (Travoprost) 7)  Metronidazole 500 Mg Tabs (Metronidazole) .Marland Kitchen.. 1 by mouth two times a day  Patient Instructions: 1)  treat for Bacterial vaginosis 2)  You will be informed of lab results when available.  Prescriptions: METRONIDAZOLE 500 MG TABS (METRONIDAZOLE) 1 by mouth two times a day  #14 x 0   Entered and Authorized by:   Madelin Headings MD   Signed by:   Madelin Headings MD on 06/19/2010   Method used:   Electronically to        [facility]  [address] [id number] (retail)       [address]       [facility] [place], [place]  [address]       Ph: [phone] or [phone]       Fax: [phone]   RxID:   [id number]    Orders Added: 1)  TLB-CBC Platelet - w/Differential [85025-CBCD] 2)  TLB-IBC Pnl (Iron/FE;Transferrin) [83550-IBC] 3)  TLB-B12 + Folate Pnl [[id number]_82607-B12/FOL] 4)  TLB-TSH (Thyroid Stimulating Hormone) [84443-TSH] 5)  TLB-T4 (Thyrox), Free [64403-KV4Q] 6)  T-Antinuclear Antib (ANA) [59563-87564] 7)  T-Vitamin D (25-Hydroxy) [phone] 8)  TLB-Sedimentation Rate (ESR) [85652-ESR] 9)  T-Culture, Urine [66063-01601] 10)  Est. Patient Level IV [09323]    Laboratory Results   Urine Tests    Routine Urinalysis   Color: yellow Appearance: Clear Glucose: negative   (Normal Range: Negative) Bilirubin: negative   (Normal Range: Negative) Ketone: negative   (Normal Range: Negative) Spec. Gravity: 1.010   (Normal Range: 1.003-1.035) Blood: trace-lysed   (Normal Range: Negative) pH: 6.5   (Normal Range: 5.0-8.0) Protein: negative   (Normal Range: Negative) Urobilinogen: 0.2   (Normal Range: 0-1) Nitrite: negative   (Normal Range: Negative) Leukocyte  Esterace: small   (Normal Range: Negative)    Comments: Hannah [name], CMA (AAMA)  June 19, 2010 10:20 AM

## 2010-11-04 NOTE — Assessment & Plan Note (Signed)
[facility]                            CARDIOLOGY OFFICE NOTE   NAME:Hannah [name], Hannah [name]                MRN:          [id number]  DATE:01/04/2007                            DOB:          09-01-1960    Hannah [name] returns for followup at the [facility]  office on January 04, 2007.  She is a delightful 50 year old woman whom I  initially saw in April of this year.  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  Her exercise Myoview study was pertinent for normally 6  minutes of exercise with significant ST and T-wave changes.  She had 1-2  mm of ST depression in the inferolateral leads.  She had normal left  ventricular contractility and a gated EF of 73%.  She had a mild  reversible defect in the anteroseptal wall which likely represented  shifting breast attenuation.   We have tried to arrange close followup but had a difficult time  contacting [name], and we have even sent her a letter to be in  contact with her to review the results of her stress tests and plan  further diagnostic testing.   At today's visit, she is doing well.  She really has not had any further  chest pain or dyspnea.  She has not been active over the last several  months.  She does not do any regular exercise, and her work is not  particularly physical.  She specifically denies any chest pain, dyspnea,  orthopnea, PND, edema, palpitations, lightheadedness, or syncope.  She  has no claudication symptoms.   CURRENT MEDICINES:  Multivitamin, oral contraceptive, and iron  supplement.   PHYSICAL EXAMINATION:  She is alert and oriented and in no acute  distress.  Her weight is 190 pounds.  Blood pressure is 103/70, heart  rate 72, respiratory rate 16.  HEENT:  Normal.  NECK:  Normal.  Carotid upstrokes without bruits.   Jugular venous  pressure is normal.  LUNGS:  Clear to auscultation bilaterally.  HEART:  Regular rate and rhythm without murmurs or gallops.  ABDOMEN:  Soft, nontender.  No organomegaly.  EXTREMITIES:  No cyanosis, clubbing, or edema.  Peripheral pulses are 2+  and equal throughout.   EKG shows normal sinus rhythm and is within normal limits.   ASSESSMENT:  Hannah [name] is a 50 year old woman with a history of  atypical chest pain and an abnormal Myoview stress test with both an  abnormal EKG and a small perfusion defect.  My suspicion is that her  stress test is a false positive, and the ischemic defect on imaging is  low risk.  However, the fact that she was only able to exercise for 6  minutes and had significant ST and T-wave changes concerns me.  I had a  long discussion with her today regarding potential options.  One would  be to pursue a definitive evaluation with cardiac catheterization to  rule out any obstructive coronary artery  disease.  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  Another option would be to treat  medically for coronary disease equivalent.  She would like to think  about things, and she will let us know how she would like to proceed.  I  actually favored a cardiac catheterization, and I expressed this to her,  but she is not sure she wants to undergo that procedure.  I went into  detail regarding risks and indications.  I have asked her to start a  daily aspirin in addition to her other medications.   I did not see any specific followup, as Hannah [name] reports that she will  be in contact with [place] over the next week to let us know what she has  decided.     Veverly Fells. Excell Seltzer, MD  Electronically Signed    MDC/MedQ  DD: 01/10/2007  DT: 01/10/2007  Job #: [id number]   cc:   Leanne Chang, M.D.

## 2010-11-07 NOTE — Assessment & Plan Note (Signed)
[facility]                          [place] OFFICE NOTE   Hannah [name], Hannah [name]                MRN:          [id number]  DATE:02/26/2006                            DOB:          1960/09/26    REASON FOR VISIT:  Recurrent vaginal discharge.   [name] presents today stating that the vaginal discharge started  again.  She was seen by me July 11 and diagnosed with bacterial vaginosis.  She does have a history of recurrent bacterial vaginosis, usually preceded  by her menstrual cycle.  She was given Flagyl 500 mg b.i.d. for 7 days.  She  reports that worked extremely well and her symptoms resolved until one week  ago.   [name] also would like assistance with weight loss.  She is  willing to start an exercise program and possibly join [employer].  She  feels that her weight has led to current low back pain.  She has difficulty  sitting in a car for long periods of time secondary to the pain in her back.  In the morning she complains of low back stiffness, that usually takes  several hours to improve.  She has been taking over-the-counter ibuprofen  p.r.n.   MEDICATIONS:  1. Albuterol.  2. Pulmicort.  3. MiraLax.   ALLERGIES:  No known drug allergies.   OBJECTIVE:  VITAL SIGNS:  Weight 199.6.  Pulse 68.  Blood pressure 102/68.  GENERAL:  We have a pleasant female who is overweight but in no acute  distress.  She answers questions appropriately.  BACK:  Examination of the back significant for paraspinal tenderness in the  lumbar region, left greater than right.  Deep tendon reflexes 2+ and equal  bilaterally.  No focal sensory motor deficits were noted.  PELVIC:  Examination was deferred per patient.   IMPRESSION:  1. Recurrent bacterial vaginosis.  2. Low back pain.  3. Weight loss management.   PLAN:  1. The patient agreed to Flagyl 500 mg b.i.d. for 7 days.  If no      improvement or recurrence of  her symptoms, further evaluation will be      warranted.  2. In regards to weight loss management, after reviewing the potential      side effects of Adipex, the patient agreed to a weight loss program      which requires at least 4-5 days of walking 30-40 minutes at a time.      She is to follow with me monthly to assess her progress.  She will be      started on Adipex 37.5 mg q.a.m.  3. I regards to her low back pain, I did provide her a prescription for      Flexeril 10 mg t.i.d. for the next 7-10 days.  Side effects were      reviewed.  4. The patient wanted to hold off on any further evaluation regarding her      back because she feels it is due to her current weight.  Leanne Chang, M.D.   LA/MedQ  DD:  02/26/2006  DT:  02/27/2006  Job #:  [id number]

## 2010-11-07 NOTE — Assessment & Plan Note (Signed)
[facility]                        [place] OFFICE NOTE   Hannah [name], Hannah [name]                MRN:          [id number]  DATE:09/09/2006                            DOB:          01-21-1961    REASON FOR VISIT:  Chest pain.  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  She  reports that she has been pretty stable in her weight and has been  trying to stay as active as possible, but due to a recent promotion, she  has been extremely busy at work.  This has led to significant stress and  anxiety.  During the time that she has been overwhelmed by the new  position, she has had 2 episodes of chest pressure that radiated into  her arm.  The patient reports that the last episode was 2 weeks ago.  The patient states that she was not active during the 2 episodes.  In  the most recent episode she was woken up from sleep.  Both times, she  stated she was then extremely anxious and overwhelmed with her work.  Over the last several weeks, she has noted improvement due to  restructuring her duties to more efficient manner.  None the less, the  patient is concerned about her heart, especially since I am recommending  physical activity to continue to manage her weight.   PAST MEDICAL HISTORY:  1. Asthma.  2. Chronic constipation.  3. Recurrent back pain.   MEDICATIONS:  1. Albuterol p.r.n.  2. Pulmicort p.r.n.  3. MiraLax p.r.n.  4. Flexeril 1/2 tablet daily p.r.n.  5. Multivitamins.  6. Biotin daily.  7. Fiber supplements daily.   ALLERGIES:  No known drug allergies.   REVIEW OF SYSTEMS:  As per HPI.  Otherwise, unremarkable.   OBJECTIVE:  Weight 184.2, pulse 78, respiratory rate of 16, blood  pressure 118/70.  GENERAL:  We have a pleasant female who has lost 20 pounds in the last 6  months.  Answers questions appropriately.  NECK:  Supple.  No lymphadenopathy, carotid  bruits, or JVD.  No  thyromegaly.  LUNGS:  Clear.  HEART:  Regular rate and rhythm.  Normal S1, S2.  No murmurs, gallops,  or rubs on examination.  EXTREMITIES:  No cyanosis, clubbing, or edema.   EKG:  Normal sinus rhythm.  No PVCs or PACs.  No LVH or RVH by voltage  criteria.  There are nonspecific ST changes in the inferior lateral  leads, but otherwise unremarkable.  No prior EKG to compare.   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  The patient has not had an episode in 3 weeks.  Symptoms are  very consistent with anxiety.  Nonetheless, given that the patient is  continuing to work on her weight and physical activity as part of this  regimen, cardiac etiology needs to be ruled out.  Additionally, the EKG  had equivocal changes.   PLAN:  1. Advised the patient to avoid any strenuous exercises until further  evaluation.  Will refer the patient to cardiology for further      assessment.  2. I will get a non-fasting lipid profile today to further stratify      her risk factors.  Of note, there is no family history of premature      heart disease and the patient is a nonsmoker.     Leanne Chang, M.D.  Electronically Signed    LA/MedQ  DD: 09/09/2006  DT: 09/09/2006  Job #: [id number]

## 2010-11-07 NOTE — Letter (Signed)
October 05, 2006    Hannah [name], M.D.  [phone], [place] [address]   RE:  Hannah, [name]  MRN:  [id number]  /  DOB:  1960-08-16   Dear Dr. Blossom Hoops:   It was my pleasure to see Hannah [name] as an outpatient at the  [facility] on October 05, 2006.  As you know, she is a very  nice, 50 year old woman who presents with a chief complaint of chest  pain.  Hannah [name] describes an episode approximately 4 weeks ago  when she experienced chest pain over the course of an entire day.  She  describes the chest pain as sharp, and in the center of her chest,  radiating up to the left shoulder and upper arm.  It was associated with  dyspnea and nausea.  The symptoms lasted over the course of much of the  day.  She rested for much of that day because she felt fatigued.  When  she woke up the following morning, she felt back to her normal state of  health.  She has had episodes of intermittent, sharp chest pain since  that time, but they are brief in nature, lasting for only a few minutes.  She has not had any typical exertional symptoms.   Her only other complaint is that of postural dizziness.  This is not an  acute problem, and she has not had any near-syncopal or syncopal  episodes.  She denies orthopnea, PND, edema, claudication symptoms, or  other complaints at this time.   CURRENT MEDICATIONS:  Only a daily multivitamin.  She takes MiraLax,  ibuprofen, and albuterol on an as needed basis.   ALLERGIES:  NO KNOWN DRUG ALLERGIES.   PAST MEDICAL HISTORY:  1. This pertinent only for pneumonia in 1994, requiring      hospitalization.  2. Asthma.  3. Remote leg fracture.  4. History of C-section.   Of note, there is no history of hypertension, diabetes, dyslipidemia, or  tobacco abuse.   FAMILY HISTORY:  The patient's mother died at age 10.  She had  hypertension and heavy alcohol use.  Her father is alive at age 45.   He  has had a silent MI in the past, and also has hypertension, diabetes,  and dyslipidemia.  She has a 7 year old sister who has hypertension.   SOCIAL HISTORY:  The patient works as a Emergency planning/management officer.  She has been in  that profession for 23 years.  She recently was promoted and is now in a  supervisory capacity.  She does not smoke cigarettes or use recreational  drugs.  She drinks alcohol on rare occasions with 1 glass of wine  approximately once a month.  She does not do regular exercise.  Ms.  [name] is married with 3 children, ages 23, 38, and 38.   REVIEW OF SYSTEMS:  A complete 12-point review of systems was performed.  Pertinent positives included chronic constipation, menstrual  dysfunction, and headaches.  All other systems were reviewed and are  negative except as detailed above.   PHYSICAL EXAMINATION:  The patient is alert and oriented.  She is in no  acute distress.  Weight is 183 pounds, blood pressure is 122/88, heart rate 70,  respiratory rate 16.  HEENT:  Normal neck, normal carotid upstrokes without bruits.  Jugular  venous pressure is normal.  No thyromegaly or thyroid nodules.  LUNGS:  Clear to auscultation bilaterally.  HEART:  The apex  is discrete and nondisplaced.  There is no right  ventricular heave or lift.  It is regular rate and rhythm without  murmurs or gallops.  ABDOMEN:  Soft, nontender, no organomegaly, no abdominal bruits.  EXTREMITIES:  No clubbing, cyanosis, or edema.  Peripheral pulses are 2+  and equal throughout.  NEUROLOGIC:  Cranial nerves II-XII are intact.  Strength is 5/5 and  equal in the arms and legs bilaterally.  SKIN:  Warm and dry without rash.  LYMPHATICS:  There is no adenopathy.   EKG shows normal sinus rhythm and is within normal limits.   Lab work was reviewed and demonstrates total cholesterol of 170 with and  HDL of 62 and an LDL of 99.  Triglycerides were 47.   ASSESSMENT:  Hannah [name] is a 50 year old  woman with chest pain.  Her symptoms are predominantly atypical.  I think that pretest  probability of her having significant obstructive coronary artery  disease is fairly low.  However, she is in a high risk profession, and I  think we should evaluate her with an exercise Myoview stress study to  rule out any significant ischemic heart disease.  She does not appear to  have any risk factors that require medical therapy for primary  prevention at this point.  She will be scheduled with her stress study,  and we will be in  contact with her after the results are available.  I will plan on seeing  her back on a p.r.n. basis unless there is some problem identified with  her stress test.   Dr. Blossom Hoops, thanks again for allowing me the opportunity to evaluate  Hannah [name].  Please feel free to call at any time with questions  regarding her care.    Sincerely,      Hannah [name]. Excell Seltzer, MD  Electronically Signed    MDC/MedQ  DD: 10/05/2006  DT: 10/05/2006  Job #: [id number]

## 2010-11-07 NOTE — Op Note (Signed)
   NAME:  Hannah [name], Hannah [name]                    ACCOUNT NO.:  [id number]   MEDICAL RECORD NO.:  [id number]                   PATIENT TYPE:   LOCATION:                                       FACILITY:   PHYSICIAN:  Kathreen Cosier, M.D.           DATE OF BIRTH:   DATE OF PROCEDURE:  09/22/2002  DATE OF DISCHARGE:                                 OPERATIVE REPORT   PREOPERATIVE DIAGNOSES:  1. Dysfunctional uterine bleeding.  2. Multiparity.   PROCEDURES:  1. Dilatation and cystoscopy, hysteroscopy.  2. Tubal ligation through a mini-laparotomy.   DESCRIPTION OF PROCEDURE:  The patient was placed on the operating table in  supine position and general anesthesia is administered.  The patient is in  lithotomy position.  Abdomen, perineum, and vagina prepped and draped,  bladder emptied with a straight catheter.  Bimanual exam revealed the uterus  to be top normal in size, negative adnexa.  With the speculum placed in the  vagina, the cervix was injected with 8 mL of 1% Xylocaine at 3, 9, and 12  o'clock.  Anterior lip of the cervix grasped with a tenaculum and the cervix  was curetted and a small amount of tissue obtained.  Endometrial cavity  sounded to 9 cm.  The cavity was anterior.  Cervix dilated to a #27 Shawnie Pons.  Then the 5 __________ diagnostic hysteroscope was inserted and a total of  400 mL of 5% sorbitol used.  The endometrial cavity was noted to be normal.  Sharp curettage was then performed and the cavity re-scoped.  It was a  normal post-D&C appearance.  A transverse suprapubic incision was then made  approximately two inches long, carried down to the fascia.  The fascia  cleanly incised the length of the incision.  The recti muscles were  retracted laterally, the peritoneum incised longitudinally.  The left tube  was grasped in the midportion with a Babcock clamp.  A 0 plain suture placed  on the mesosalpinx, the lower portion of the tube was then clamped.  This  was  tied on both sides and approximately two inches of tube transected.  Hemostasis was satisfactory.  The procedure done in a similar fashion on the  other side.  Lap and sponge count correct.  Abdomen closed in layers,  peritoneum with continuous interrupted 0 chromic, fascia with continuous  __________, skin closed with subcuticular suture of 3-0 plain.  The patient  tolerated the procedure well, came to the recovery room in good condition.  Blood loss was approximately 50 mL.                                                 Kathreen Cosier, M.D.    BAM/MEDQ  D:  09/22/2002  T:  09/23/2002  Job:  [id number]

## 2011-02-10 ENCOUNTER — Telehealth: Payer: Self-pay | Admitting: Internal Medicine

## 2011-02-10 NOTE — Telephone Encounter (Signed)
Can do 11/7 at 10:15am. Schedule labs 1 week prior.

## 2011-02-10 NOTE — Telephone Encounter (Signed)
Pt is scheduled for a cpx in beginning of Jan. It was the first available and pt would like to come in before then. Please advise.

## 2011-03-03 ENCOUNTER — Encounter: Payer: Self-pay | Admitting: Internal Medicine

## 2011-03-03 ENCOUNTER — Telehealth: Payer: Self-pay | Admitting: *Deleted

## 2011-03-03 ENCOUNTER — Ambulatory Visit (INDEPENDENT_AMBULATORY_CARE_PROVIDER_SITE_OTHER): Payer: 59 | Admitting: Internal Medicine

## 2011-03-03 VITALS — BP 120/80 | HR 107 | Temp 98.8°F | Wt 206.0 lb

## 2011-03-03 DIAGNOSIS — R059 Cough, unspecified: Secondary | ICD-10-CM | POA: Insufficient documentation

## 2011-03-03 DIAGNOSIS — R05 Cough: Secondary | ICD-10-CM

## 2011-03-03 DIAGNOSIS — J45909 Unspecified asthma, uncomplicated: Secondary | ICD-10-CM

## 2011-03-03 MED ORDER — PREDNISONE 20 MG PO TABS: 20.0000 mg | ORAL_TABLET | Freq: Every day | ORAL | Status: AC

## 2011-03-03 MED ORDER — HYDROCODONE-HOMATROPINE 5-1.5 MG/5ML PO SYRP: 5.0000 mL | ORAL_SOLUTION | ORAL | Status: AC | PRN

## 2011-03-03 MED ORDER — AZITHROMYCIN 250 MG PO TABS: ORAL_TABLET | ORAL | Status: AC

## 2011-03-03 MED ORDER — ALBUTEROL SULFATE (2.5 MG/3ML) 0.083% IN NEBU
2.5000 mg | INHALATION_SOLUTION | Freq: Once | RESPIRATORY_TRACT | Status: AC
  Administered 2011-03-03: 2.5 mg via RESPIRATORY_TRACT

## 2011-03-03 MED ORDER — AEROCHAMBER MV MISC: Status: AC

## 2011-03-03 NOTE — Progress Notes (Signed)
  Subjective:    Patient ID: Hannah [name], female    DOB: 05-22-1961, 50 y.o.   MRN: [id number]  HPI  Patient comes in for an acute visit today. She has a concern because of complaint of Runny nose and dry cough    For a week and hoarse now.  Seen at work  clinic and was given ventolin  To help  But cough is still dry and bad.  Slight mucous  Tight but no sob.  Used inhaler without help. Mid chest  Sore  No real sob but hard to get good air Remote history of asthma as a child but nothing recently other is got sick recently at home no tobacco some ETS Review of Systems Neg fever  Itching but last night perfume that someone was wearing make her cough more. She had to go outside. No specific wheezing the chest feels tight. 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 She has a remote history of asthma as a child    Objective:   Physical Exam Hoarse in nad   Deep  Bronchial cough. Dry  sometime persistent  HEENT: Normocephalic ;atraumatic , Eyes;  PERRL, EOMs  Full, lids and conjunctiva clear,,Ears: no deformities, canals nl, TM landmarks normal, Nose: no deformity or discharge  Mild congestion  Mouth : OP clear without lesion or edema . Prominent uvula Neck no masses or dc  Chest:  Clear to A&P without wheezes rales or rhonchi CV:  S1-S2 no gallops or murmurs peripheral perfusion is normal No clubbing cyanosis or edema  After nebulizer treatment she feels a lot more relaxed and her cough has decreased she feels more comfortable she is still hoarse there is no stridor      Assessment & Plan:  Laryngitis  Spasmodic deep cough  rempte hx of asthma Poss atypicals  Vs viral  With underlying allergy predisposition  her clinical response to nebulizer given today is consistent with bronchospasm.  Nebulizer given today .  We'll treat with prednisone a spacer given for her inhaler and to antibiotic coverage for possible atypicals.   Expectant management with close followup.

## 2011-03-03 NOTE — Telephone Encounter (Signed)
Pt is complaining of a hacking, dry, cough. Hx of asthma. Will see Dr. Fabian Sharp this afternoon.

## 2011-03-03 NOTE — Patient Instructions (Signed)
Cough med for comfort   Take prednisone and antibiotic  For allergic cough possibly infection triggered  Spacer for you inhaler can use every  6 hours.

## 2011-03-04 ENCOUNTER — Encounter: Payer: Self-pay | Admitting: Internal Medicine

## 2011-03-24 ENCOUNTER — Other Ambulatory Visit: Payer: Self-pay | Admitting: Obstetrics and Gynecology

## 2011-03-24 DIAGNOSIS — Z1231 Encounter for screening mammogram for malignant neoplasm of breast: Secondary | ICD-10-CM

## 2011-04-22 ENCOUNTER — Other Ambulatory Visit (INDEPENDENT_AMBULATORY_CARE_PROVIDER_SITE_OTHER): Payer: 59

## 2011-04-22 DIAGNOSIS — Z0000 Encounter for general adult medical examination without abnormal findings: Secondary | ICD-10-CM

## 2011-04-22 LAB — CBC WITH DIFFERENTIAL/PLATELET
Eosinophils Relative: 1.6 % (ref 0.0–5.0)
HCT: 43 % (ref 36.0–46.0)
Hemoglobin: 14.3 g/dL (ref 12.0–15.0)
Lymphs Abs: 2.8 10*3/uL (ref 0.7–4.0)
Monocytes Relative: 7.1 % (ref 3.0–12.0)
Neutro Abs: 3.3 10*3/uL (ref 1.4–7.7)
Platelets: 232 10*3/uL (ref 150.0–400.0)
WBC: 6.7 10*3/uL (ref 4.5–10.5)

## 2011-04-22 LAB — POCT URINALYSIS DIPSTICK
Blood, UA: NEGATIVE
Protein, UA: NEGATIVE
pH, UA: 6.5

## 2011-04-22 LAB — BASIC METABOLIC PANEL
Calcium: 9.5 mg/dL (ref 8.4–10.5)
GFR: 63.49 mL/min (ref >60.00)
Sodium: 138 mEq/L (ref 135–145)

## 2011-04-22 LAB — LIPID PANEL
HDL: 60 mg/dL (ref >39.00)
LDL Cholesterol: 106 mg/dL — ABNORMAL HIGH (ref 0–99)
Total CHOL/HDL Ratio: 3
VLDL: 12 mg/dL (ref 0.0–40.0)

## 2011-04-22 LAB — HEPATIC FUNCTION PANEL
ALT: 25 U/L (ref 0–35)
AST: 22 U/L (ref 0–37)
Alkaline Phosphatase: 70 U/L (ref 39–117)
Total Bilirubin: 0.3 mg/dL (ref 0.3–1.2)

## 2011-04-29 ENCOUNTER — Encounter: Payer: Self-pay | Admitting: Internal Medicine

## 2011-04-29 ENCOUNTER — Ambulatory Visit (INDEPENDENT_AMBULATORY_CARE_PROVIDER_SITE_OTHER): Payer: 59 | Admitting: Internal Medicine

## 2011-04-29 VITALS — BP 120/80 | HR 66 | Ht 68.0 in | Wt 202.0 lb

## 2011-04-29 DIAGNOSIS — Z90710 Acquired absence of both cervix and uterus: Secondary | ICD-10-CM

## 2011-04-29 DIAGNOSIS — Z0000 Encounter for general adult medical examination without abnormal findings: Secondary | ICD-10-CM

## 2011-04-29 DIAGNOSIS — Z23 Encounter for immunization: Secondary | ICD-10-CM

## 2011-04-29 DIAGNOSIS — J309 Allergic rhinitis, unspecified: Secondary | ICD-10-CM

## 2011-04-29 DIAGNOSIS — J45909 Unspecified asthma, uncomplicated: Secondary | ICD-10-CM

## 2011-04-29 DIAGNOSIS — H40009 Preglaucoma, unspecified, unspecified eye: Secondary | ICD-10-CM

## 2011-04-29 NOTE — Progress Notes (Signed)
Subjective:    Patient ID: Hannah [name], female    DOB: 1960-07-20, 50 y.o.   MRN: [id number]  HPI Patient comes in today for preventive visit and follow-up of medical issues. Update of her history since her last visit. EYE inc IOP  Different drops   Good pressures.   Asthma allergy doing ok. Not needing inhaler.  Flu shot Sleep is good.   Review of Systems ROS:  GEN/ HEENTNo fever, significant weight changes sweats headaches vision problems hearing changes, CV/ PULM; No chest pain shortness of breath cough, syncope,edema  change in exercise tolerance. GI /GU: No adominal pain, vomiting, change in bowel habits. No blood in the stool. No significant GU symptoms. SKIN/HEME: ,no acute skin rashes suspicious lesions or bleeding. No lymphadenopathy, nodules, masses.  NEURO/ PSYCH:  No neurologic signs such as weakness numbness No depression anxiety. ocass twitching  Jaw.  At times no dysphagia IMM/ Allergy: No unusual infections.  Allergy .   REST of 12 system review negative  Past history family history social history reviewed in the electronic medical record. Stress at work is coping  Past Medical History  Diagnosis Date  . Chest pain, atypical   . Rhinitis   . Nevus, atypical   . Chronic constipation   . Osteochondroma   . Asthma   . Uterine cancer   . H/O: hysterectomy 2011    presented with irregular bleeding  . ICP (infantile cerebral palsy)     elevated   Past Surgical History  Procedure Date  . Cesarean section   . Hernia repair   . Foot surgery   . Total vaginal hysterectomy 2011    reports that she has never smoked. She does not have any smokeless tobacco history on file. She reports that she drinks alcohol. She reports that she does not use illicit drugs. family history includes Alcohol abuse in her mother; Colon polyps in her other; Diabetes in her father; and Hypertension in her mother and sister. No Known Allergies      Objective:   Physical  Exam Physical Exam: Vital signs reviewed [id number] is a well-developed well-nourished alert cooperative   aafemale who appears her stated age in no acute distress.  HEENT: normocephalic  traumatic , Eyes: PERRL EOM's full, conjunctiva clear, Nares: paten,t no deformity discharge or tenderness., Ears: no deformity EAC's clear TMs with normal landmarks. Mouth: clear OP, no lesions, edema.  Moist mucous membranes. Dentition in adequate repair. NECK: supple without masses, thyromegaly or bruits. CHEST/PULM:  Clear to auscultation and percussion breath sounds equal no wheeze , rales or rhonchi. No chest wall deformities or tenderness. CV: PMI is nondisplaced, S1 S2 no gallops, murmurs, rubs. Peripheral pulses are full without delay.No JVD .  Breast: normal by inspection . No dimpling, discharge, masses, tenderness or discharge .  ABDOMEN: Bowel sounds normal nontender  No guard or rebound, no hepato splenomegal no CVA tenderness.  No hernia. Extremtities:  No clubbing cyanosis or edema, no acute joint swelling or redness no focal atrophy NEURO:  Oriented x3, cranial nerves 3-12 appear to be intact, no obvious focal weakness,gait within normal limits no abnormal reflexes or asymmetrical SKIN: No acute rashes normal turgor, color, no bruising or petechiae. PSYCH: Oriented, good eye contact, no obvious depression anxiety, cognition and judgment appear normal. GYNE NI today   Lab Results  Component Value Date   WBC 6.7 04/22/2011   HGB 14.3 04/22/2011   HCT 43.0 04/22/2011   PLT 232.0 04/22/2011   GLUCOSE 94  04/22/2011   CHOL 178 04/22/2011   TRIG 60.0 04/22/2011   HDL 60.00 04/22/2011   LDLDIRECT 137.9 10/10/2008   LDLCALC 106* 04/22/2011   ALT 25 04/22/2011   AST 22 04/22/2011   NA 138 04/22/2011   K 3.9 04/22/2011   CL 104 04/22/2011   CREATININE 1.2 04/22/2011   BUN 18 04/22/2011   CO2 24 04/22/2011   TSH 1.56 04/22/2011   HGBA1C 5.9 01/14/2010   Above reviewed with pt      Assessment & Plan:  Preventive Health Care Counseled regarding healthy nutrition, exercise, sleep, injury prevention, calcium vit d and healthy weight . Flu shot today. Up to date  on healthcare parameters per hx   Weight reduction would be  Helpful Family hx of heart disease death mom with etoh and tobacco also  No need for lipid med at this time  lsi would be the most appropriate  Asthma allergy stable .

## 2011-04-29 NOTE — Patient Instructions (Signed)
Continue lifestyle intervention healthy eating and exercise . Mediterranean diet is heart healthy . Lots of fruits veges whole grains and limiting red meat   Some weight loss will also help. Check  yearly with labs

## 2011-05-22 ENCOUNTER — Ambulatory Visit
Admission: RE | Admit: 2011-05-22 | Discharge: 2011-05-22 | Disposition: A | Discharge status: Home / Self Care | Payer: 59 | Source: Ambulatory Visit | Attending: Obstetrics and Gynecology | Admitting: Obstetrics and Gynecology

## 2011-05-22 DIAGNOSIS — Z1231 Encounter for screening mammogram for malignant neoplasm of breast: Secondary | ICD-10-CM

## 2011-05-26 IMAGING — CR DG CERVICAL SPINE COMPLETE 4+V
5 series · 5 of 5 positions shown · non-contrast
Comparison: None

CLINICAL DATA: Right sided neck pain radiating down right arm

CERVICAL SPINE - COMPLETE 4+ VIEW

[view not recorded (1 of 5)]
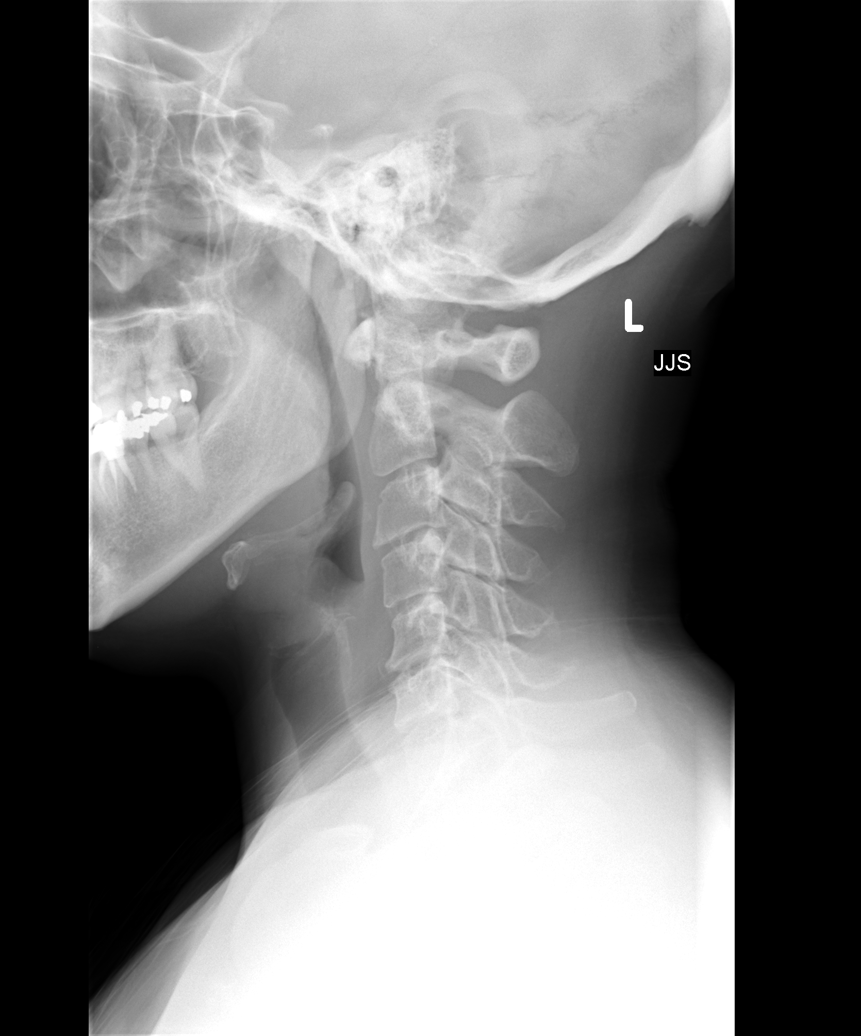

[view not recorded (2 of 5)]
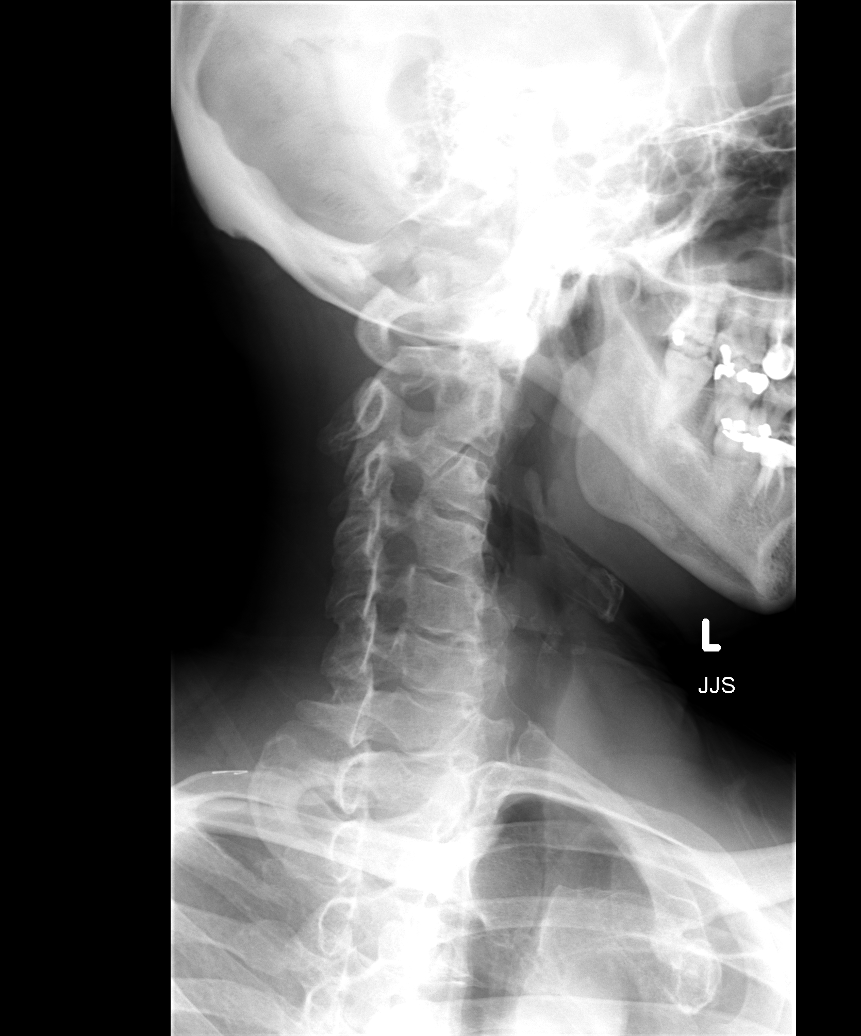

[view not recorded (3 of 5)]
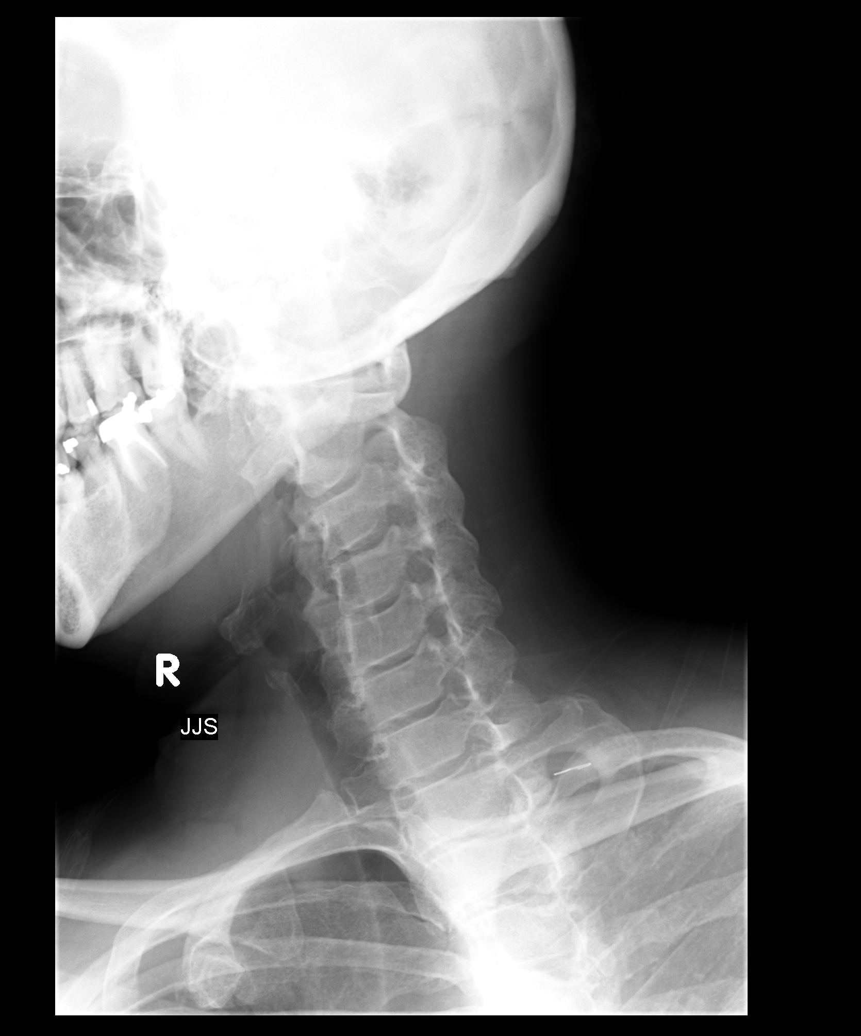

[view not recorded (4 of 5)]
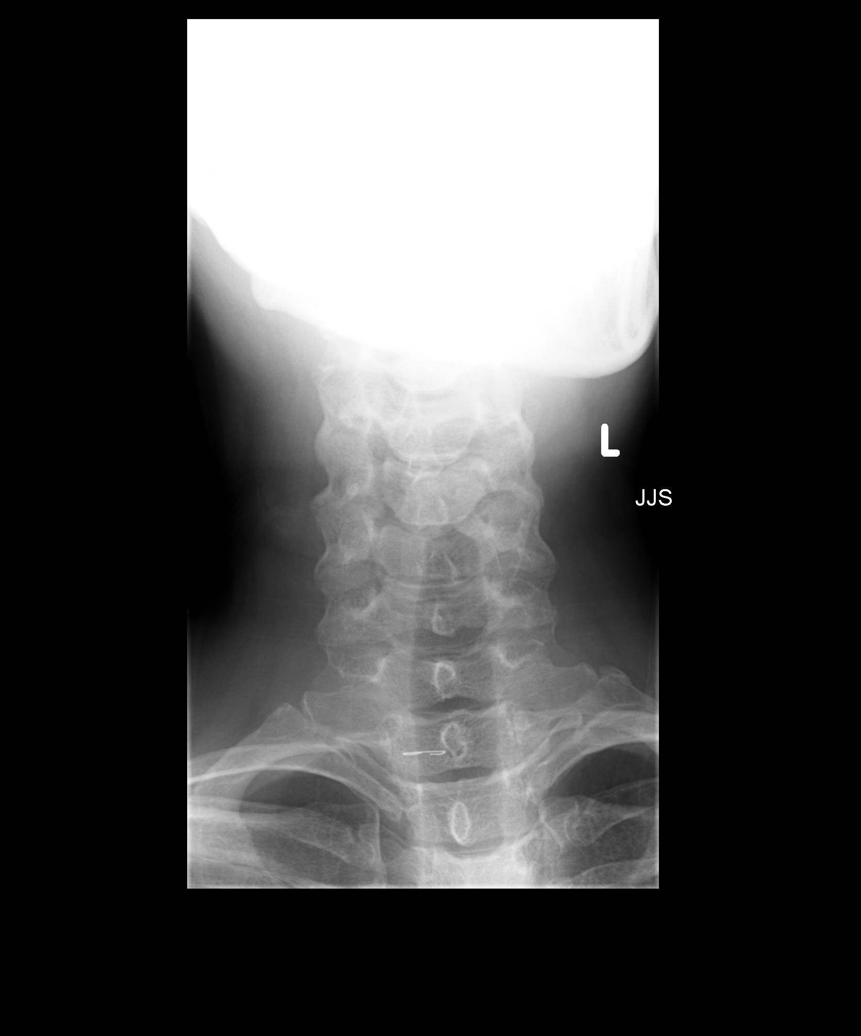

[view not recorded (5 of 5)]
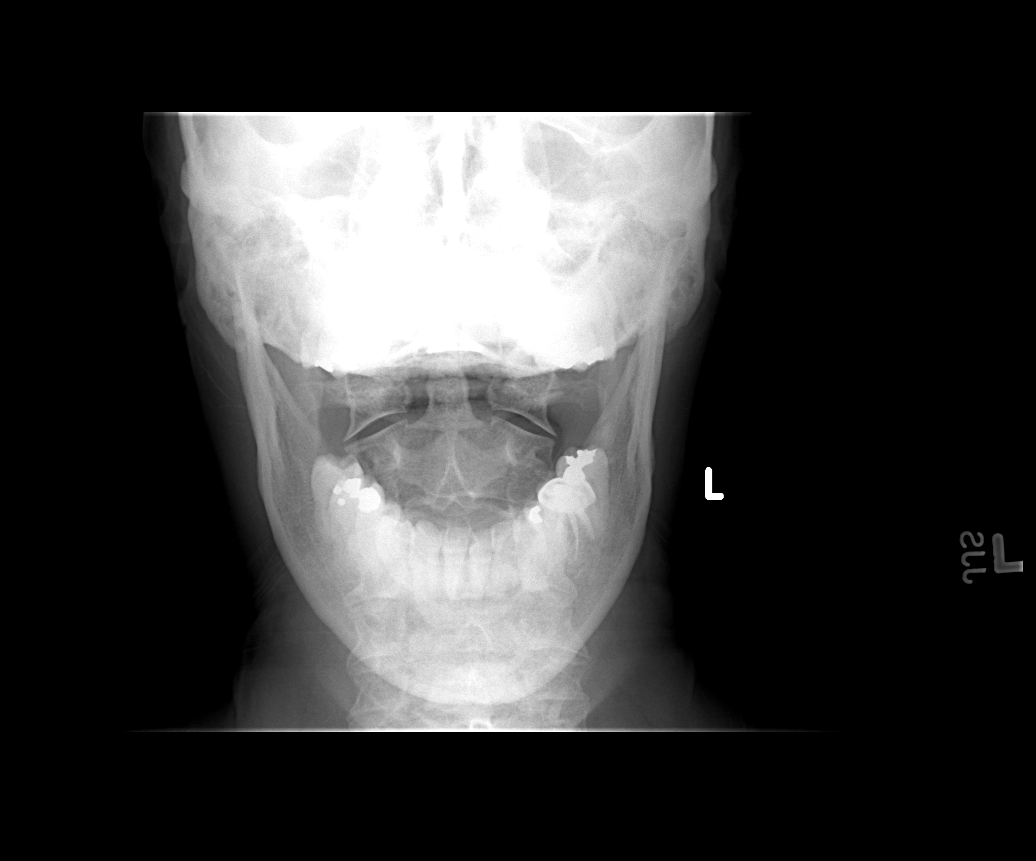

[5 of 5 positions shown; findings below may reference images not displayed]

FINDINGS: There is loss of the normal cervical lordotic curve.
There is disc narrowing at C5-6 and probably C6-7.  No foraminal
stenosis.  No fractures or subluxation.  Prevertebral soft tissues
normal.
IMPRESSION: Loss of lordosis and degenerative disc disease - no acute findings.

## 2011-05-27 ENCOUNTER — Other Ambulatory Visit: Payer: Self-pay | Admitting: Obstetrics and Gynecology

## 2011-05-27 DIAGNOSIS — R928 Other abnormal and inconclusive findings on diagnostic imaging of breast: Secondary | ICD-10-CM

## 2011-05-29 ENCOUNTER — Other Ambulatory Visit: Payer: Self-pay | Admitting: Obstetrics and Gynecology

## 2011-05-29 ENCOUNTER — Ambulatory Visit
Admission: RE | Admit: 2011-05-29 | Discharge: 2011-05-29 | Disposition: A | Discharge status: Home / Self Care | Payer: 59 | Source: Ambulatory Visit | Attending: Obstetrics and Gynecology | Admitting: Obstetrics and Gynecology

## 2011-05-29 DIAGNOSIS — R928 Other abnormal and inconclusive findings on diagnostic imaging of breast: Secondary | ICD-10-CM

## 2011-06-01 ENCOUNTER — Ambulatory Visit
Admission: RE | Admit: 2011-06-01 | Discharge: 2011-06-01 | Disposition: A | Discharge status: Home / Self Care | Payer: 59 | Source: Ambulatory Visit | Attending: Obstetrics and Gynecology | Admitting: Obstetrics and Gynecology

## 2011-06-01 DIAGNOSIS — R928 Other abnormal and inconclusive findings on diagnostic imaging of breast: Secondary | ICD-10-CM

## 2011-06-10 ENCOUNTER — Other Ambulatory Visit: Payer: 59

## 2011-06-24 ENCOUNTER — Other Ambulatory Visit: Payer: Self-pay

## 2011-06-30 ENCOUNTER — Encounter: Payer: Self-pay | Admitting: Internal Medicine

## 2011-09-03 ENCOUNTER — Telehealth: Payer: Self-pay | Admitting: Internal Medicine

## 2011-09-04 NOTE — Telephone Encounter (Signed)
Spoke with patient and recall placed in [facility] for Oct. 2013

## 2011-09-04 NOTE — Telephone Encounter (Signed)
Record reviewed, last colon 03/2009 - long redundant colon, unable to see the cecal pouch. Will need a stiffer scope and Propofol next time. Under the circumstances I would repeat colon in 3 year interval, in Oct 2013

## 2011-09-04 NOTE — Telephone Encounter (Signed)
Patient had a normal colonoscopy 03/12/09 and has a 10 year recall. She is calling due to some changes in her health history that she thinks might change her recall. States she has had a history of uterine cancer since the colon. Also, her sister had a colonoscopy and had a large polyp removed(path not back yet on it). She wants to know if she should have her colonoscopy earlier due to these changes. Old chart ordered for MD to review also. Please, advise.

## 2011-11-12 ENCOUNTER — Encounter (HOSPITAL_COMMUNITY): Payer: Self-pay | Admitting: *Deleted

## 2011-11-12 ENCOUNTER — Emergency Department (HOSPITAL_COMMUNITY)
Admission: EM | Admit: 2011-11-12 | Discharge: 2011-11-12 | Disposition: A | Discharge status: Home / Self Care | Payer: 59 | Source: Home / Self Care | Attending: Emergency Medicine | Admitting: Emergency Medicine

## 2011-11-12 DIAGNOSIS — L03115 Cellulitis of right lower limb: Secondary | ICD-10-CM

## 2011-11-12 DIAGNOSIS — L02419 Cutaneous abscess of limb, unspecified: Secondary | ICD-10-CM

## 2011-11-12 MED ORDER — DOXYCYCLINE HYCLATE 100 MG PO CAPS: 100.0000 mg | ORAL_CAPSULE | Freq: Two times a day (BID) | ORAL | Status: AC

## 2011-11-12 NOTE — ED Provider Notes (Signed)
History     CSN: [id number]  Arrival date & time 11/12/11  1747   First MD Initiated Contact with Patient 11/12/11 1751      Chief Complaint  Patient presents with  . Leg Swelling    (Consider location/radiation/quality/duration/timing/severity/associated sxs/prior treatment) HPI Comments: Patient presents with a red looking the swollen area above her right knee has been increasing in size and tenderness the last 3-4 days. He is unaware of how this could have happened does not recall any insect bites or stings, denies any trauma. Patient also denies any fevers, chills, fatigue or body aches. "It does look bigger redder and it feels more tender than yesterday"  The history is provided by the patient.    Past Medical History  Diagnosis Date  . Chest pain, atypical   . Rhinitis   . Nevus, atypical   . Chronic constipation   . Osteochondroma   . Asthma   . Uterine cancer   . H/O: hysterectomy 2011    presented with irregular bleeding  . ICP (infantile cerebral palsy)     elevated    Past Surgical History  Procedure Date  . Cesarean section   . Hernia repair   . Foot surgery   . Total vaginal hysterectomy 2011    Family History  Problem Relation Age of Onset  . Hypertension Mother     deceased  . Alcohol abuse Mother   . Hypertension Sister   . Diabetes Father     died from complications of dm, amputation and sepsis in his 52's   . Colon polyps Other     History  Substance Use Topics  . Smoking status: Never Smoker   . Smokeless tobacco: Not on file  . Alcohol Use: Yes    OB History    Grav Para Term Preterm Abortions TAB SAB Ect Mult Living   3 3              Review of Systems  Constitutional: Negative for chills, activity change and appetite change.  Skin: Positive for color change and rash. Negative for wound.    Allergies  Review of patient's allergies indicates no known allergies.  Home Medications   Current Outpatient Rx  Name Route Sig  Dispense Refill  . ALBUTEROL SULFATE HFA 108 (90 BASE) MCG/ACT IN AERS Inhalation Inhale 2 puffs into the lungs every 6 (six) hours as needed.      Marland Kitchen BIOTIN 5 MG PO TABS Oral Take by mouth.      Marland Kitchen BRINZOLAMIDE 1 % OP SUSP Both Eyes Place 1 drop into both eyes 2 (two) times daily.      Marland Kitchen DOXYCYCLINE HYCLATE 100 MG PO CAPS Oral Take 1 capsule (100 mg total) by mouth 2 (two) times daily. 20 capsule 0  . OMEGA-3 FATTY ACIDS 1000 MG PO CAPS Oral Take 2 g by mouth daily.      . MULTIPLE VITAMIN PO Oral Take by mouth.      Marland Kitchen PROBIOTIC FORMULA PO Oral Take by mouth.      Ival Bible MV MISC  Use as instructed 1 each 0    BP 125/82  Pulse 96  Temp(Src) 98.5 F (36.9 C) (Oral)  Resp 18  SpO2 99%  Physical Exam  Nursing note and vitals reviewed. Constitutional: She appears well-developed and well-nourished.  Musculoskeletal: She exhibits tenderness.       Right hip: She exhibits tenderness and swelling. She exhibits normal range of motion, normal strength, no  crepitus, no deformity and no laceration.       Legs: Neurological: She is alert.  Skin: There is erythema.    ED Course  Procedures (including critical care time)  Labs Reviewed - No data to display No results found.   1. Cellulitis of right thigh       MDM  Patient localized cellulitis on the anterior aspect of her right eye. No constitutional symptoms and no further rashes. Exam was consistent with cellulitis as there is tender, erythema increased warmth. No fluctuance to suggest an abscess. Perspiration start with antibiotics and expect significant improvement in 48-72 hours. Encouraged to return if any worsening or further changes or new symptoms. And understood and agree with treatment plan and followup care as necessary.        Jimmie Molly, MD 11/12/11 [phone]

## 2011-11-12 NOTE — Discharge Instructions (Signed)
  Area of tenderness should be much improve after 48 hours after taking treatment complete full treatment as discussed. Turner further concerns or changes   Cellulitis Cellulitis is an infection of the tissue under the skin. The infected area is usually red and tender. This is caused by germs. These germs enter the body through cuts or sores. This usually happens in the arms or lower legs. HOME CARE   Take your medicine as told. Finish it even if you start to feel better.   If the infection is on the arm or leg, keep it raised (elevated).   Use a warm cloth on the infected area several times a day.   See your doctor for a follow-up visit as told.  GET HELP RIGHT AWAY IF:   You are tired or confused.   You throw up (vomit).   You have watery poop (diarrhea).   You feel ill and have muscle aches.   You have a fever.  MAKE SURE YOU:   Understand these instructions.   Will watch your condition.   Will get help right away if you are not doing well or get worse.  Document Released: 11/25/2007 Document Revised: 05/28/2011 Document Reviewed: 05/10/2009 [facility] Patient Information 2012 [place], [place].

## 2011-11-12 NOTE — ED Notes (Signed)
Pt  Has  A  Red  Tender  Swollen   Area   To  The  Area  Above  r  Knee  Area  Which  She   Noticed     3-4  Days      Ago        No  Known      Insect    Bite       Or  Other  Causative  Agent

## 2012-07-06 ENCOUNTER — Other Ambulatory Visit (INDEPENDENT_AMBULATORY_CARE_PROVIDER_SITE_OTHER): Payer: 59

## 2012-07-06 DIAGNOSIS — Z0000 Encounter for general adult medical examination without abnormal findings: Secondary | ICD-10-CM

## 2012-07-06 LAB — BASIC METABOLIC PANEL
CO2: 30 mEq/L (ref 19–32)
Chloride: 105 mEq/L (ref 96–112)
Potassium: 5 mEq/L (ref 3.5–5.1)
Sodium: 141 mEq/L (ref 135–145)

## 2012-07-06 LAB — HEPATIC FUNCTION PANEL
AST: 18 U/L (ref 0–37)
Albumin: 4.3 g/dL (ref 3.5–5.2)
Alkaline Phosphatase: 68 U/L (ref 39–117)
Bilirubin, Direct: 0.1 mg/dL (ref 0.0–0.3)
Total Bilirubin: 0.7 mg/dL (ref 0.3–1.2)

## 2012-07-06 LAB — CBC WITH DIFFERENTIAL/PLATELET
Basophils Relative: 0.3 % (ref 0.0–3.0)
Eosinophils Absolute: 0.2 10*3/uL (ref 0.0–0.7)
Eosinophils Relative: 2.4 % (ref 0.0–5.0)
HCT: 42.7 % (ref 36.0–46.0)
Hemoglobin: 14 g/dL (ref 12.0–15.0)
Lymphs Abs: 2.4 10*3/uL (ref 0.7–4.0)
MCHC: 32.7 g/dL (ref 30.0–36.0)
MCV: 85 fl (ref 78.0–100.0)
Monocytes Absolute: 0.5 10*3/uL (ref 0.1–1.0)
Neutro Abs: 3.6 10*3/uL (ref 1.4–7.7)
Neutrophils Relative %: 54.4 % (ref 43.0–77.0)
RBC: 5.02 Mil/uL (ref 3.87–5.11)
WBC: 6.6 10*3/uL (ref 4.5–10.5)

## 2012-07-06 LAB — LIPID PANEL
HDL: 47.6 mg/dL (ref >39.00)
Total CHOL/HDL Ratio: 4
Triglycerides: 71 mg/dL (ref 0.0–149.0)
VLDL: 14.2 mg/dL (ref 0.0–40.0)

## 2012-07-13 ENCOUNTER — Ambulatory Visit (INDEPENDENT_AMBULATORY_CARE_PROVIDER_SITE_OTHER): Payer: 59 | Admitting: Internal Medicine

## 2012-07-13 ENCOUNTER — Encounter: Payer: Self-pay | Admitting: Internal Medicine

## 2012-07-13 VITALS — BP 120/86 | HR 104 | Temp 98.2°F | Ht 67.5 in | Wt 214.0 lb

## 2012-07-13 DIAGNOSIS — Z0000 Encounter for general adult medical examination without abnormal findings: Secondary | ICD-10-CM

## 2012-07-13 NOTE — Patient Instructions (Addendum)
Intensify lifestyle interventions. Weight loss and healthy eating and exercise will help blood sugar cholesterol and blood pressure  . Tracking activity  And eating and sleep helps  To be successful. Yearly check with labs  or as needed   Preventive Care for Adults, Female A healthy lifestyle and preventive care can promote health and wellness. Preventive health guidelines for women include the following key practices.  A routine yearly physical is a good way to check with your caregiver about your health and preventive screening. It is a chance to share any concerns and updates on your health, and to receive a thorough exam.  Visit your dentist for a routine exam and preventive care every 6 months. Brush your teeth twice a day and floss once a day. Good oral hygiene prevents tooth decay and gum disease.  The frequency of eye exams is based on your age, health, family medical history, use of contact lenses, and other factors. Follow your caregiver's recommendations for frequency of eye exams.  Eat a healthy diet. Foods like vegetables, fruits, whole grains, low-fat dairy products, and lean protein foods contain the nutrients you need without too many calories. Decrease your intake of foods high in solid fats, added sugars, and salt. Eat the right amount of calories for you.Get information about a proper diet from your caregiver, if necessary.  Regular physical exercise is one of the most important things you can do for your health. Most adults should get at least 150 minutes of moderate-intensity exercise (any activity that increases your heart rate and causes you to sweat) each week. In addition, most adults need muscle-strengthening exercises on 2 or more days a week.  Maintain a healthy weight. The body mass index (BMI) is a screening tool to identify possible weight problems. It provides an estimate of body fat based on height and weight. Your caregiver can help determine your BMI, and can  help you achieve or maintain a healthy weight.For adults 20 years and older:  A BMI below 18.5 is considered underweight.  A BMI of 18.5 to 24.9 is normal.  A BMI of 25 to 29.9 is considered overweight.  A BMI of 30 and above is considered obese.  Maintain normal blood lipids and cholesterol levels by exercising and minimizing your intake of saturated fat. Eat a balanced diet with plenty of fruit and vegetables. Blood tests for lipids and cholesterol should begin at age 46 and be repeated every 5 years. If your lipid or cholesterol levels are high, you are over 50, or you are at high risk for heart disease, you may need your cholesterol levels checked more frequently.Ongoing high lipid and cholesterol levels should be treated with medicines if diet and exercise are not effective.  If you smoke, find out from your caregiver how to quit. If you do not use tobacco, do not start.  If you are pregnant, do not drink alcohol. If you are breastfeeding, be very cautious about drinking alcohol. If you are not pregnant and choose to drink alcohol, do not exceed 1 drink per day. One drink is considered to be 12 ounces (355 mL) of beer, 5 ounces (148 mL) of wine, or 1.5 ounces (44 mL) of liquor.  Avoid use of street drugs. Do not share needles with anyone. Ask for help if you need support or instructions about stopping the use of drugs.  High blood pressure causes heart disease and increases the risk of stroke. Your blood pressure should be checked at least every 1  to 2 years. Ongoing high blood pressure should be treated with medicines if weight loss and exercise are not effective.  If you are 36 to 52 years old, ask your caregiver if you should take aspirin to prevent strokes.  Diabetes screening involves taking a blood sample to check your fasting blood sugar level. This should be done once every 3 years, after age 69, if you are within normal weight and without risk factors for diabetes. Testing  should be considered at a younger age or be carried out more frequently if you are overweight and have at least 1 risk factor for diabetes.  Breast cancer screening is essential preventive care for women. You should practice "breast self-awareness." This means understanding the normal appearance and feel of your breasts and may include breast self-examination. Any changes detected, no matter how small, should be reported to a caregiver. Women in their 17s and 30s should have a clinical breast exam (CBE) by a caregiver as part of a regular health exam every 1 to 3 years. After age 31, women should have a CBE every year. Starting at age 80, women should consider having a mammography (breast X-ray test) every year. Women who have a family history of breast cancer should talk to their caregiver about genetic screening. Women at a high risk of breast cancer should talk to their caregivers about having magnetic resonance imaging (MRI) and a mammography every year.  The Pap test is a screening test for cervical cancer. A Pap test can show cell changes on the cervix that might become cervical cancer if left untreated. A Pap test is a procedure in which cells are obtained and examined from the lower end of the uterus (cervix).  Women should have a Pap test starting at age 59.  Between ages 27 and 47, Pap tests should be repeated every 2 years.  Beginning at age 77, you should have a Pap test every 3 years as long as the past 3 Pap tests have been normal.  Some women have medical problems that increase the chance of getting cervical cancer. Talk to your caregiver about these problems. It is especially important to talk to your caregiver if a new problem develops soon after your last Pap test. In these cases, your caregiver may recommend more frequent screening and Pap tests.  The above recommendations are the same for women who have or have not gotten the vaccine for human papillomavirus (HPV).  If you had a  hysterectomy for a problem that was not cancer or a condition that could lead to cancer, then you no longer need Pap tests. Even if you no longer need a Pap test, a regular exam is a good idea to make sure no other problems are starting.  If you are between ages 105 and 11, and you have had normal Pap tests going back 10 years, you no longer need Pap tests. Even if you no longer need a Pap test, a regular exam is a good idea to make sure no other problems are starting.  If you have had past treatment for cervical cancer or a condition that could lead to cancer, you need Pap tests and screening for cancer for at least 20 years after your treatment.  If Pap tests have been discontinued, risk factors (such as a new sexual partner) need to be reassessed to determine if screening should be resumed.  The HPV test is an additional test that may be used for cervical cancer screening. The HPV  test looks for the virus that can cause the cell changes on the cervix. The cells collected during the Pap test can be tested for HPV. The HPV test could be used to screen women aged 26 years and older, and should be used in women of any age who have unclear Pap test results. After the age of 53, women should have HPV testing at the same frequency as a Pap test.  Colorectal cancer can be detected and often prevented. Most routine colorectal cancer screening begins at the age of 81 and continues through age 74. However, your caregiver may recommend screening at an earlier age if you have risk factors for colon cancer. On a yearly basis, your caregiver may provide home test kits to check for hidden blood in the stool. Use of a small camera at the end of a tube, to directly examine the colon (sigmoidoscopy or colonoscopy), can detect the earliest forms of colorectal cancer. Talk to your caregiver about this at age 2, when routine screening begins. Direct examination of the colon should be repeated every 5 to 10 years through age  43, unless early forms of pre-cancerous polyps or small growths are found.  Hepatitis C blood testing is recommended for all people born from [birth year] through 1965 and any individual with known risks for hepatitis C.  Practice safe sex. Use condoms and avoid high-risk sexual practices to reduce the spread of sexually transmitted infections (STIs). STIs include gonorrhea, chlamydia, syphilis, trichomonas, herpes, HPV, and human immunodeficiency virus (HIV). Herpes, HIV, and HPV are viral illnesses that have no cure. They can result in disability, cancer, and death. Sexually active women aged 66 and younger should be checked for chlamydia. Older women with new or multiple partners should also be tested for chlamydia. Testing for other STIs is recommended if you are sexually active and at increased risk.  Osteoporosis is a disease in which the bones lose minerals and strength with aging. This can result in serious bone fractures. The risk of osteoporosis can be identified using a bone density scan. Women ages 53 and over and women at risk for fractures or osteoporosis should discuss screening with their caregivers. Ask your caregiver whether you should take a calcium supplement or vitamin D to reduce the rate of osteoporosis.  Menopause can be associated with physical symptoms and risks. Hormone replacement therapy is available to decrease symptoms and risks. You should talk to your caregiver about whether hormone replacement therapy is right for you.  Use sunscreen with sun protection factor (SPF) of 30 or more. Apply sunscreen liberally and repeatedly throughout the day. You should seek shade when your shadow is shorter than you. Protect yourself by wearing long sleeves, pants, a wide-brimmed hat, and sunglasses year round, whenever you are outdoors.  Once a month, do a whole body skin exam, using a mirror to look at the skin on your back. Notify your caregiver of new moles, moles that have irregular borders,  moles that are larger than a pencil eraser, or moles that have changed in shape or color.  Stay current with required immunizations.  Influenza. You need a dose every fall (or winter). The composition of the flu vaccine changes each year, so being vaccinated once is not enough.  Pneumococcal polysaccharide. You need 1 to 2 doses if you smoke cigarettes or if you have certain chronic medical conditions. You need 1 dose at age 38 (or older) if you have never been vaccinated.  Tetanus, diphtheria, pertussis (Tdap, Td). Get  1 dose of Tdap vaccine if you are younger than age 73, are over 110 and have contact with an infant, are a Research scientist (physical sciences), are pregnant, or simply want to be protected from whooping cough. After that, you need a Td booster dose every 10 years. Consult your caregiver if you have not had at least 3 tetanus and diphtheria-containing shots sometime in your life or have a deep or dirty wound.  HPV. You need this vaccine if you are a woman age 48 or younger. The vaccine is given in 3 doses over 6 months.  Measles, mumps, rubella (MMR). You need at least 1 dose of MMR if you were born in 1957 or later. You may also need a second dose.  Meningococcal. If you are age 27 to 93 and a first-year college student living in a residence hall, or have one of several medical conditions, you need to get vaccinated against meningococcal disease. You may also need additional booster doses.  Zoster (shingles). If you are age 73 or older, you should get this vaccine.  Varicella (chickenpox). If you have never had chickenpox or you were vaccinated but received only 1 dose, talk to your caregiver to find out if you need this vaccine.  Hepatitis A. You need this vaccine if you have a specific risk factor for hepatitis A virus infection or you simply wish to be protected from this disease. The vaccine is usually given as 2 doses, 6 to 18 months apart.  Hepatitis B. You need this vaccine if you have a  specific risk factor for hepatitis B virus infection or you simply wish to be protected from this disease. The vaccine is given in 3 doses, usually over 6 months. Preventive Services / Frequency Ages 74 to 73  Blood pressure check.** / Every 1 to 2 years.  Lipid and cholesterol check.** / Every 5 years beginning at age 27.  Clinical breast exam.** / Every year after age 58.  Mammogram.** / Every year beginning at age 65 and continuing for as long as you are in good health. Consult with your caregiver.  Pap test.** / Every 3 years starting at age 6 through age 74 or 69 with a history of 3 consecutive normal Pap tests.  HPV screening.** / Every 3 years from ages 51 through ages 82 to 30 with a history of 3 consecutive normal Pap tests.  Fecal occult blood test (FOBT) of stool. / Every year beginning at age 19 and continuing until age 47. You may not need to do this test if you get a colonoscopy every 10 years.  Flexible sigmoidoscopy or colonoscopy.** / Every 5 years for a flexible sigmoidoscopy or every 10 years for a colonoscopy beginning at age 44 and continuing until age 70.  Hepatitis C blood test.** / For all people born from [birth year] through 1965 and any individual with known risks for hepatitis C.  Skin self-exam. / Monthly.  Influenza immunization.** / Every year.  Pneumococcal polysaccharide immunization.** / 1 to 2 doses if you smoke cigarettes or if you have certain chronic medical conditions.  Tetanus, diphtheria, pertussis (Tdap, Td) immunization.** / A one-time dose of Tdap vaccine. After that, you need a Td booster dose every 10 years.  Measles, mumps, rubella (MMR) immunization. / You need at least 1 dose of MMR if you were born in 1957 or later. You may also need a second dose.  Varicella immunization.** / Consult your caregiver.  Meningococcal immunization.** / Consult your caregiver.  Hepatitis  A immunization.** / Consult your caregiver. 2 doses, 6 to 18 months  apart.  Hepatitis B immunization.** / Consult your caregiver. 3 doses, usually over 6 months. Ages 37 and over  Blood pressure check.** / Every 1 to 2 years.  Lipid and cholesterol check.** / Every 5 years beginning at age 60.  Clinical breast exam.** / Every year after age 86.  Mammogram.** / Every year beginning at age 48 and continuing for as long as you are in good health. Consult with your caregiver.  Pap test.** / Every 3 years starting at age 46 through age 73 or 37 with a 3 consecutive normal Pap tests. Testing can be stopped between 65 and 70 with 3 consecutive normal Pap tests and no abnormal Pap or HPV tests in the past 10 years.  HPV screening.** / Every 3 years from ages 98 through ages 64 or 47 with a history of 3 consecutive normal Pap tests. Testing can be stopped between 65 and 70 with 3 consecutive normal Pap tests and no abnormal Pap or HPV tests in the past 10 years.  Fecal occult blood test (FOBT) of stool. / Every year beginning at age 66 and continuing until age 21. You may not need to do this test if you get a colonoscopy every 10 years.  Flexible sigmoidoscopy or colonoscopy.** / Every 5 years for a flexible sigmoidoscopy or every 10 years for a colonoscopy beginning at age 33 and continuing until age 50.  Hepatitis C blood test.** / For all people born from [birth year] through 1965 and any individual with known risks for hepatitis C.  Osteoporosis screening.** / A one-time screening for women ages 37 and over and women at risk for fractures or osteoporosis.  Skin self-exam. / Monthly.  Influenza immunization.** / Every year.  Pneumococcal polysaccharide immunization.** / 1 dose at age 50 (or older) if you have never been vaccinated.  Tetanus, diphtheria, pertussis (Tdap, Td) immunization. / A one-time dose of Tdap vaccine if you are over 65 and have contact with an infant, are a Research scientist (physical sciences), or simply want to be protected from whooping cough. After that, you  need a Td booster dose every 10 years.  Varicella immunization.** / Consult your caregiver.  Meningococcal immunization.** / Consult your caregiver.  Hepatitis A immunization.** / Consult your caregiver. 2 doses, 6 to 18 months apart.  Hepatitis B immunization.** / Check with your caregiver. 3 doses, usually over 6 months. ** Family history and personal history of risk and conditions may change your caregiver's recommendations. Document Released: 08/04/2001 Document Revised: 08/31/2011 Document Reviewed: 11/03/2010 [facility] Patient Information 2013 [place], [place].

## 2012-07-13 NOTE — Progress Notes (Signed)
Chief Complaint  Patient presents with  . Annual Exam    HPI: Patient comes in today for Preventive Health Care visit  No major change in health status since last visit .  Not exercising and has gained some weight but doing well.  No new sx has had a clear gyne onc check  No recently labs eyes stable  ROS:  GEN/ HEENT: No fever, significant weight changes sweats headaches vision problems hearing changes, CV/ PULM; No chest pain shortness of breath cough, syncope,edema  change in exercise tolerance. GI /GU: No adominal pain, vomiting, change in bowel habits. No blood in the stool. No significant GU symptoms. SKIN/HEME: ,no acute skin rashes suspicious lesions or bleeding. No lymphadenopathy, nodules, masses.  NEURO/ PSYCH:  No neurologic signs such as weakness numbness. No depression anxiety. IMM/ Allergy: No unusual infections.  Allergy .   REST of 12 system review negative except as per HPI fam hx of ht and lipids.  Past Medical History  Diagnosis Date  . Chest pain, atypical   . Rhinitis   . Nevus, atypical   . Chronic constipation   . Osteochondroma(M9210/0)   . Asthma   . Uterine cancer   . H/O: hysterectomy 2011    presented with irregular bleeding  . ICP (infantile cerebral palsy)     elevated  . OSTEOCHONDROMA 10/10/2008    Qualifier: Diagnosis of  By: Yetta Barre CMA, Chemira      Family History  Problem Relation Age of Onset  . Hypertension Mother     deceased  . Alcohol abuse Mother   . Hypertension Sister   . Diabetes Father     died from complications of dm, amputation and sepsis in his 67's   . Colon polyps Other     History   Social History  . Marital Status: Married    Spouse Name: N/A    Number of Children: N/A  . Years of Education: N/A   Social History Main Topics  . Smoking status: Never Smoker   . Smokeless tobacco: None  . Alcohol Use: Yes  . Drug Use: No  . Sexually Active: None   Other Topics Concern  . None   Social History Narrative     The patient works as a Emergency planning/management officer.  She has been in the profession for 25 years.  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 She does not smoke cigarettes or use recreational drugs.  She drinks alcohol on rare occasions with 1 glass of wine. Approx. Once a month.  She does not do regular exercise.   recently HH of 3 hysband and 43 yo son Pet poodleHusband is a smokerMcAdoo-Rogers is married with 3 children, ages  One fr college another on her own     Outpatient Encounter Prescriptions as of 07/13/2012  Medication Sig Dispense Refill  . albuterol (VENTOLIN HFA) 108 (90 BASE) MCG/ACT inhaler Inhale 2 puffs into the lungs every 6 (six) hours as needed.        . Biotin 5 MG TABS Take by mouth.        . fish oil-omega-3 fatty acids 1000 MG capsule Take 2 g by mouth daily.        . MULTIPLE VITAMIN PO Take by mouth.        . Probiotic Product (PROBIOTIC FORMULA PO) Take by mouth.        . Tafluprost (ZIOPTAN) 0.0015 % SOLN Apply 1 drop to eye daily.      . [  DISCONTINUED] brinzolamide (AZOPT) 1 % ophthalmic suspension Place 1 drop into both eyes 2 (two) times daily.          EXAM:  BP 120/86  Pulse 104  Temp 98.2 F (36.8 C) (Oral)  Ht 5' 7.5" (1.715 m)  Wt 214 lb (97.07 kg)  BMI 33.02 kg/m2  SpO2 97%  Body mass index is 33.02 kg/(m^2). Wt Readings from Last 3 Encounters:  07/13/12 214 lb (97.07 kg)  04/29/11 202 lb (91.627 kg)  03/03/11 206 lb (93.441 kg)    Physical Exam: Vital signs reviewed [id number] is a well-developed well-nourished alert cooperative   female who appears her stated age in no acute distress.  HEENT: normocephalic atraumatic , Eyes: PERRL EOM's full, conjunctiva clear, Nares: paten,t no deformity discharge or tenderness., Ears: no deformity EAC's clear TMs with normal landmarks. Mouth: clear OP, no lesions, edema.  Moist mucous membranes. Dentition in adequate repair. NECK: supple without masses, thyromegaly or bruits. CHEST/PULM:  Clear to  auscultation and percussion breath sounds equal no wheeze , rales or rhonchi. No chest wall deformities or tenderness. Breast: normal by inspection . No dimpling, discharge, masses, tenderness or discharge . CV: PMI is nondisplaced, S1 S2 no gallops, murmurs, rubs. Peripheral pulses are full without delay.No JVD .  ABDOMEN: Bowel sounds normal nontender  No guard or rebound, no hepato splenomegal no CVA tenderness.  No hernia. Extremtities:  No clubbing cyanosis or edema, no acute joint swelling or redness no focal atrophy NEURO:  Oriented x3, cranial nerves 3-12 appear to be intact, no obvious focal weakness,gait within normal limits no abnormal reflexes or asymmetrical SKIN: No acute rashes normal turgor, color, no bruising or petechiae. PSYCH: Oriented, good eye contact, no obvious depression anxiety, cognition and judgment appear normal. LN: no cervical axillary inguinal adenopathy  Lab Results  Component Value Date   WBC 6.6 07/06/2012   HGB 14.0 07/06/2012   HCT 42.7 07/06/2012   PLT 262.0 07/06/2012   GLUCOSE 100* 07/06/2012   CHOL 186 07/06/2012   TRIG 71.0 07/06/2012   HDL 47.60 07/06/2012   LDLDIRECT 137.9 10/10/2008   LDLCALC 124* 07/06/2012   ALT 18 07/06/2012   AST 18 07/06/2012   NA 141 07/06/2012   K 5.0 07/06/2012   CL 105 07/06/2012   CREATININE 1.1 07/06/2012   BUN 17 07/06/2012   CO2 30 07/06/2012   TSH 1.85 07/06/2012   HGBA1C 5.9 01/14/2010    ASSESSMENT AND PLAN:  Discussed the following assessment and plan:  1. Visit for preventive health examination    lipids bg bp slighlty worse related to weight gain intensify lsi and rechck next year  utd HCM   weight loss advised   Pt planning and motivated to lose weight in a healthy manner.  Patient Care Team: Madelin Headings, MD as PCP - General Hart Carwin, MD (Gastroenterology) Jeannette Corpus, MD as Attending Physician (Gynecology) Serita Kyle, MD (Obstetrics and Gynecology) Tonny Bollman, MD  (Cardiology) Patient Instructions  Intensify lifestyle interventions. Weight loss and healthy eating and exercise will help blood sugar cholesterol and blood pressure  . Tracking activity  And eating and sleep helps  To be successful. Yearly check with labs  or as needed   Preventive Care for Adults, Female A healthy lifestyle and preventive care can promote health and wellness. Preventive health guidelines for women include the following key practices.  A routine yearly physical is a good way to check with your caregiver about your health  and preventive screening. It is a chance to share any concerns and updates on your health, and to receive a thorough exam.  Visit your dentist for a routine exam and preventive care every 6 months. Brush your teeth twice a day and floss once a day. Good oral hygiene prevents tooth decay and gum disease.  The frequency of eye exams is based on your age, health, family medical history, use of contact lenses, and other factors. Follow your caregiver's recommendations for frequency of eye exams.  Eat a healthy diet. Foods like vegetables, fruits, whole grains, low-fat dairy products, and lean protein foods contain the nutrients you need without too many calories. Decrease your intake of foods high in solid fats, added sugars, and salt. Eat the right amount of calories for you.Get information about a proper diet from your caregiver, if necessary.  Regular physical exercise is one of the most important things you can do for your health. Most adults should get at least 150 minutes of moderate-intensity exercise (any activity that increases your heart rate and causes you to sweat) each week. In addition, most adults need muscle-strengthening exercises on 2 or more days a week.  Maintain a healthy weight. The body mass index (BMI) is a screening tool to identify possible weight problems. It provides an estimate of body fat based on height and weight. Your caregiver  can help determine your BMI, and can help you achieve or maintain a healthy weight.For adults 20 years and older:  A BMI below 18.5 is considered underweight.  A BMI of 18.5 to 24.9 is normal.  A BMI of 25 to 29.9 is considered overweight.  A BMI of 30 and above is considered obese.  Maintain normal blood lipids and cholesterol levels by exercising and minimizing your intake of saturated fat. Eat a balanced diet with plenty of fruit and vegetables. Blood tests for lipids and cholesterol should begin at age 86 and be repeated every 5 years. If your lipid or cholesterol levels are high, you are over 50, or you are at high risk for heart disease, you may need your cholesterol levels checked more frequently.Ongoing high lipid and cholesterol levels should be treated with medicines if diet and exercise are not effective.  If you smoke, find out from your caregiver how to quit. If you do not use tobacco, do not start.  If you are pregnant, do not drink alcohol. If you are breastfeeding, be very cautious about drinking alcohol. If you are not pregnant and choose to drink alcohol, do not exceed 1 drink per day. One drink is considered to be 12 ounces (355 mL) of beer, 5 ounces (148 mL) of wine, or 1.5 ounces (44 mL) of liquor.  Avoid use of street drugs. Do not share needles with anyone. Ask for help if you need support or instructions about stopping the use of drugs.  High blood pressure causes heart disease and increases the risk of stroke. Your blood pressure should be checked at least every 1 to 2 years. Ongoing high blood pressure should be treated with medicines if weight loss and exercise are not effective.  If you are 22 to 52 years old, ask your caregiver if you should take aspirin to prevent strokes.  Diabetes screening involves taking a blood sample to check your fasting blood sugar level. This should be done once every 3 years, after age 64, if you are within normal weight and without  risk factors for diabetes. Testing should be considered at a  younger age or be carried out more frequently if you are overweight and have at least 1 risk factor for diabetes.  Breast cancer screening is essential preventive care for women. You should practice "breast self-awareness." This means understanding the normal appearance and feel of your breasts and may include breast self-examination. Any changes detected, no matter how small, should be reported to a caregiver. Women in their 76s and 30s should have a clinical breast exam (CBE) by a caregiver as part of a regular health exam every 1 to 3 years. After age 33, women should have a CBE every year. Starting at age 42, women should consider having a mammography (breast X-ray test) every year. Women who have a family history of breast cancer should talk to their caregiver about genetic screening. Women at a high risk of breast cancer should talk to their caregivers about having magnetic resonance imaging (MRI) and a mammography every year.  The Pap test is a screening test for cervical cancer. A Pap test can show cell changes on the cervix that might become cervical cancer if left untreated. A Pap test is a procedure in which cells are obtained and examined from the lower end of the uterus (cervix).  Women should have a Pap test starting at age 52.  Between ages 69 and 6, Pap tests should be repeated every 2 years.  Beginning at age 61, you should have a Pap test every 3 years as long as the past 3 Pap tests have been normal.  Some women have medical problems that increase the chance of getting cervical cancer. Talk to your caregiver about these problems. It is especially important to talk to your caregiver if a new problem develops soon after your last Pap test. In these cases, your caregiver may recommend more frequent screening and Pap tests.  The above recommendations are the same for women who have or have not gotten the vaccine for human  papillomavirus (HPV).  If you had a hysterectomy for a problem that was not cancer or a condition that could lead to cancer, then you no longer need Pap tests. Even if you no longer need a Pap test, a regular exam is a good idea to make sure no other problems are starting.  If you are between ages 57 and 66, and you have had normal Pap tests going back 10 years, you no longer need Pap tests. Even if you no longer need a Pap test, a regular exam is a good idea to make sure no other problems are starting.  If you have had past treatment for cervical cancer or a condition that could lead to cancer, you need Pap tests and screening for cancer for at least 20 years after your treatment.  If Pap tests have been discontinued, risk factors (such as a new sexual partner) need to be reassessed to determine if screening should be resumed.  The HPV test is an additional test that may be used for cervical cancer screening. The HPV test looks for the virus that can cause the cell changes on the cervix. The cells collected during the Pap test can be tested for HPV. The HPV test could be used to screen women aged 36 years and older, and should be used in women of any age who have unclear Pap test results. After the age of 79, women should have HPV testing at the same frequency as a Pap test.  Colorectal cancer can be detected and often prevented. Most routine colorectal  cancer screening begins at the age of 20 and continues through age 25. However, your caregiver may recommend screening at an earlier age if you have risk factors for colon cancer. On a yearly basis, your caregiver may provide home test kits to check for hidden blood in the stool. Use of a small camera at the end of a tube, to directly examine the colon (sigmoidoscopy or colonoscopy), can detect the earliest forms of colorectal cancer. Talk to your caregiver about this at age 36, when routine screening begins. Direct examination of the colon should be  repeated every 5 to 10 years through age 81, unless early forms of pre-cancerous polyps or small growths are found.  Hepatitis C blood testing is recommended for all people born from [birth year] through 1965 and any individual with known risks for hepatitis C.  Practice safe sex. Use condoms and avoid high-risk sexual practices to reduce the spread of sexually transmitted infections (STIs). STIs include gonorrhea, chlamydia, syphilis, trichomonas, herpes, HPV, and human immunodeficiency virus (HIV). Herpes, HIV, and HPV are viral illnesses that have no cure. They can result in disability, cancer, and death. Sexually active women aged 1 and younger should be checked for chlamydia. Older women with new or multiple partners should also be tested for chlamydia. Testing for other STIs is recommended if you are sexually active and at increased risk.  Osteoporosis is a disease in which the bones lose minerals and strength with aging. This can result in serious bone fractures. The risk of osteoporosis can be identified using a bone density scan. Women ages 69 and over and women at risk for fractures or osteoporosis should discuss screening with their caregivers. Ask your caregiver whether you should take a calcium supplement or vitamin D to reduce the rate of osteoporosis.  Menopause can be associated with physical symptoms and risks. Hormone replacement therapy is available to decrease symptoms and risks. You should talk to your caregiver about whether hormone replacement therapy is right for you.  Use sunscreen with sun protection factor (SPF) of 30 or more. Apply sunscreen liberally and repeatedly throughout the day. You should seek shade when your shadow is shorter than you. Protect yourself by wearing long sleeves, pants, a wide-brimmed hat, and sunglasses year round, whenever you are outdoors.  Once a month, do a whole body skin exam, using a mirror to look at the skin on your back. Notify your caregiver of new  moles, moles that have irregular borders, moles that are larger than a pencil eraser, or moles that have changed in shape or color.  Stay current with required immunizations.  Influenza. You need a dose every fall (or winter). The composition of the flu vaccine changes each year, so being vaccinated once is not enough.  Pneumococcal polysaccharide. You need 1 to 2 doses if you smoke cigarettes or if you have certain chronic medical conditions. You need 1 dose at age 63 (or older) if you have never been vaccinated.  Tetanus, diphtheria, pertussis (Tdap, Td). Get 1 dose of Tdap vaccine if you are younger than age 50, are over 3 and have contact with an infant, are a Research scientist (physical sciences), are pregnant, or simply want to be protected from whooping cough. After that, you need a Td booster dose every 10 years. Consult your caregiver if you have not had at least 3 tetanus and diphtheria-containing shots sometime in your life or have a deep or dirty wound.  HPV. You need this vaccine if you are a woman age  26 or younger. The vaccine is given in 3 doses over 6 months.  Measles, mumps, rubella (MMR). You need at least 1 dose of MMR if you were born in 1957 or later. You may also need a second dose.  Meningococcal. If you are age 57 to 74 and a first-year college student living in a residence hall, or have one of several medical conditions, you need to get vaccinated against meningococcal disease. You may also need additional booster doses.  Zoster (shingles). If you are age 65 or older, you should get this vaccine.  Varicella (chickenpox). If you have never had chickenpox or you were vaccinated but received only 1 dose, talk to your caregiver to find out if you need this vaccine.  Hepatitis A. You need this vaccine if you have a specific risk factor for hepatitis A virus infection or you simply wish to be protected from this disease. The vaccine is usually given as 2 doses, 6 to 18 months apart.  Hepatitis  B. You need this vaccine if you have a specific risk factor for hepatitis B virus infection or you simply wish to be protected from this disease. The vaccine is given in 3 doses, usually over 6 months. Preventive Services / Frequency Ages 59 to 81  Blood pressure check.** / Every 1 to 2 years.  Lipid and cholesterol check.** / Every 5 years beginning at age 41.  Clinical breast exam.** / Every year after age 62.  Mammogram.** / Every year beginning at age 80 and continuing for as long as you are in good health. Consult with your caregiver.  Pap test.** / Every 3 years starting at age 85 through age 11 or 46 with a history of 3 consecutive normal Pap tests.  HPV screening.** / Every 3 years from ages 46 through ages 59 to 10 with a history of 3 consecutive normal Pap tests.  Fecal occult blood test (FOBT) of stool. / Every year beginning at age 24 and continuing until age 20. You may not need to do this test if you get a colonoscopy every 10 years.  Flexible sigmoidoscopy or colonoscopy.** / Every 5 years for a flexible sigmoidoscopy or every 10 years for a colonoscopy beginning at age 22 and continuing until age 8.  Hepatitis C blood test.** / For all people born from [birth year] through 1965 and any individual with known risks for hepatitis C.  Skin self-exam. / Monthly.  Influenza immunization.** / Every year.  Pneumococcal polysaccharide immunization.** / 1 to 2 doses if you smoke cigarettes or if you have certain chronic medical conditions.  Tetanus, diphtheria, pertussis (Tdap, Td) immunization.** / A one-time dose of Tdap vaccine. After that, you need a Td booster dose every 10 years.  Measles, mumps, rubella (MMR) immunization. / You need at least 1 dose of MMR if you were born in 1957 or later. You may also need a second dose.  Varicella immunization.** / Consult your caregiver.  Meningococcal immunization.** / Consult your caregiver.  Hepatitis A immunization.** / Consult your  caregiver. 2 doses, 6 to 18 months apart.  Hepatitis B immunization.** / Consult your caregiver. 3 doses, usually over 6 months. Ages 37 and over  Blood pressure check.** / Every 1 to 2 years.  Lipid and cholesterol check.** / Every 5 years beginning at age 98.  Clinical breast exam.** / Every year after age 37.  Mammogram.** / Every year beginning at age 49 and continuing for as long as you are in good health.  Consult with your caregiver.  Pap test.** / Every 3 years starting at age 84 through age 59 or 61 with a 3 consecutive normal Pap tests. Testing can be stopped between 65 and 70 with 3 consecutive normal Pap tests and no abnormal Pap or HPV tests in the past 10 years.  HPV screening.** / Every 3 years from ages 79 through ages 72 or 74 with a history of 3 consecutive normal Pap tests. Testing can be stopped between 65 and 70 with 3 consecutive normal Pap tests and no abnormal Pap or HPV tests in the past 10 years.  Fecal occult blood test (FOBT) of stool. / Every year beginning at age 9 and continuing until age 10. You may not need to do this test if you get a colonoscopy every 10 years.  Flexible sigmoidoscopy or colonoscopy.** / Every 5 years for a flexible sigmoidoscopy or every 10 years for a colonoscopy beginning at age 18 and continuing until age 25.  Hepatitis C blood test.** / For all people born from [birth year] through 1965 and any individual with known risks for hepatitis C.  Osteoporosis screening.** / A one-time screening for women ages 67 and over and women at risk for fractures or osteoporosis.  Skin self-exam. / Monthly.  Influenza immunization.** / Every year.  Pneumococcal polysaccharide immunization.** / 1 dose at age 41 (or older) if you have never been vaccinated.  Tetanus, diphtheria, pertussis (Tdap, Td) immunization. / A one-time dose of Tdap vaccine if you are over 65 and have contact with an infant, are a Research scientist (physical sciences), or simply want to be protected from  whooping cough. After that, you need a Td booster dose every 10 years.  Varicella immunization.** / Consult your caregiver.  Meningococcal immunization.** / Consult your caregiver.  Hepatitis A immunization.** / Consult your caregiver. 2 doses, 6 to 18 months apart.  Hepatitis B immunization.** / Check with your caregiver. 3 doses, usually over 6 months. ** Family history and personal history of risk and conditions may change your caregiver's recommendations. Document Released: 08/04/2001 Document Revised: 08/31/2011 Document Reviewed: 11/03/2010 [facility] Patient Information 2013 [place], [place].     Neta Mends. Lars Jeziorski M.D. Health Maintenance  Topic Date Due  . Influenza Vaccine  02/20/2013  . Mammogram  05/28/2013  . Pap Smear  04/28/2014  . Tetanus/tdap  06/22/2014  . Colonoscopy  04/28/2016   Health Maintenance Review

## 2012-09-06 ENCOUNTER — Other Ambulatory Visit: Payer: Self-pay | Admitting: Occupational Medicine

## 2012-09-06 ENCOUNTER — Ambulatory Visit
Admission: RE | Admit: 2012-09-06 | Discharge: 2012-09-06 | Disposition: A | Discharge status: Home / Self Care | Payer: Worker's Compensation | Source: Ambulatory Visit | Attending: Occupational Medicine | Admitting: Occupational Medicine

## 2012-10-14 ENCOUNTER — Encounter: Payer: Self-pay | Admitting: Internal Medicine

## 2012-10-21 ENCOUNTER — Telehealth: Payer: Self-pay | Admitting: Internal Medicine

## 2012-10-24 ENCOUNTER — Encounter: Payer: Self-pay | Admitting: *Deleted

## 2012-10-24 NOTE — Telephone Encounter (Signed)
Spoke with patient and she states she thinks she is due for colonoscopy but wants to be sure. Verified she is due. She reports she has had a cancer since her last colonoscopy. States she had a cancer of lining of uterus. Scheduled OV with Dr. Juanda Chance to discuss health changes.

## 2012-10-28 ENCOUNTER — Ambulatory Visit (INDEPENDENT_AMBULATORY_CARE_PROVIDER_SITE_OTHER): Payer: 59 | Admitting: Internal Medicine

## 2012-10-28 ENCOUNTER — Encounter: Payer: Self-pay | Admitting: Internal Medicine

## 2012-10-28 VITALS — BP 112/80 | HR 83 | Temp 99.2°F | Wt 213.0 lb

## 2012-10-28 DIAGNOSIS — D229 Melanocytic nevi, unspecified: Secondary | ICD-10-CM

## 2012-10-28 DIAGNOSIS — D239 Other benign neoplasm of skin, unspecified: Secondary | ICD-10-CM

## 2012-10-28 NOTE — Patient Instructions (Signed)
No charge for todays bvisit   [facility]   Is the name of derm and  Experts in assessment .

## 2012-10-28 NOTE — Progress Notes (Signed)
Patient has a mole that she thinks is getting larger and under her bra line may have scratched it but was bleeding. Cannot tell how much it is changing.  Area right side thorax a slightly larger than pencil eraser size dark homogeneous raised slightly irritated mole.  Base clear.   Question abnormal mole.  ?  Discussed options. Unfortunately because this was not scheduled as a mole removal and I id not  assess the mole previously would have to have her come back later  Today or resched  But unfortunately as she has already waited. In an SDA appt  Slot.  This  Morning. Option to reschedule  come back or see the surgical skin center  no charge for this visit

## 2012-12-07 ENCOUNTER — Encounter: Payer: Self-pay | Admitting: Internal Medicine

## 2012-12-13 ENCOUNTER — Encounter: Payer: Self-pay | Admitting: Internal Medicine

## 2012-12-13 ENCOUNTER — Ambulatory Visit (INDEPENDENT_AMBULATORY_CARE_PROVIDER_SITE_OTHER): Payer: 59 | Admitting: Internal Medicine

## 2012-12-13 VITALS — BP 112/70 | HR 64 | Ht 69.0 in | Wt 214.0 lb

## 2012-12-13 DIAGNOSIS — Z8371 Family history of colonic polyps: Secondary | ICD-10-CM

## 2012-12-13 MED ORDER — MOVIPREP 100 G PO SOLR: 1.0000 | Freq: Once | ORAL | Status: DC

## 2012-12-13 NOTE — Patient Instructions (Addendum)
You have been scheduled for a colonoscopy with propofol. Please follow written instructions given to you at your visit today.  Please pick up your prep kit at the pharmacy within the next 1-3 days. If you use inhalers (even only as needed), please bring them with you on the day of your procedure. Your physician has requested that you go to www.startemmi.com and enter the access code given to you at your visit today. This web site gives a general overview about your procedure. However, you should still follow specific instructions given to you by our office regarding your preparation for the procedure.  CC:Dr Panosh

## 2012-12-13 NOTE — Progress Notes (Signed)
Hannah [name] May 13, 1961 MRN [id number]        History of Present Illness:  This is a 52 year old [place] American female here to discuss recall colonoscopy. She has a family history of colon polyps in her father. She had a normal colonoscopy in 2006 but had a long and redundant colon. Repeat colonoscopy in September 2010 again showed long redundant colon. We were unable to see the cecal pouch. Her sister just had a colonoscopy with removal of a large polyp. Patient is concerned about quality of the last exam and the fact that her sister and father had colon polyps and feels that she would like to have a repeat colonoscopy. Her bowel habits are regular. She denies rectal bleeding.   Past Medical History  Diagnosis Date  . Chest pain, atypical   . Rhinitis   . Nevus, atypical   . Chronic constipation   . Osteochondroma   . Asthma   . Uterine cancer   . H/O: hysterectomy 2011    presented with irregular bleeding  . ICP (infantile cerebral palsy)     elevated  . OSTEOCHONDROMA 10/10/2008    Qualifier: Diagnosis of  By: Yetta Barre CMA, Chemira    . Iron deficiency anemia    Past Surgical History  Procedure Laterality Date  . Cesarean section    . Umbilical hernia repair    . Foot surgery    . Total vaginal hysterectomy  2011  . Tubal ligation    . Myomectomy      reports that she has never smoked. She does not have any smokeless tobacco history on file. She reports that  drinks alcohol. She reports that she does not use illicit drugs. family history includes Alcohol abuse in her mother; Breast cancer in her maternal aunt; Colon polyps in an unspecified family member; Diabetes in her father; and Hypertension in her mother and sister. No Known Allergies      Review of Systems: Denies abdominal pain rectal bleeding nausea vomiting  The remainder of the 10 point ROS is negative except as outlined in H&P   Physical Exam: General appearance  Well developed, in no  distress. Eyes- non icteric. HEENT nontraumatic, normocephalic. Mouth no lesions, tongue papillated, no cheilosis. Neck supple without adenopathy, thyroid not enlarged, no carotid bruits, no JVD. Lungs Clear to auscultation bilaterally. Cor normal S1, normal S2, regular rhythm, no murmur,  quiet precordium. Abdomen: Soft nontender abdomen with normoactive bowel sounds. Liver edge at costal margin. No distention Rectal: Not done Extremities no pedal edema. Skin no lesions. Neurological alert and oriented x 3. Psychological normal mood and affect.  Assessment and Plan:  52 year old [place] American female that the risk factors for colon polyps in her father and her sister. Last colonoscopy 4 years ago was incomplete due to redundant colon. I think it is reasonable to repeat a colonoscopy at this time. We will give her split prepped and used propofol as well as the stiffer scope which is more likely to negotiate the redundant colon   12/13/2012 Lina Sar

## 2013-01-11 ENCOUNTER — Encounter: Payer: Self-pay | Admitting: Internal Medicine

## 2013-02-21 ENCOUNTER — Telehealth: Payer: Self-pay | Admitting: Internal Medicine

## 2013-02-21 NOTE — Telephone Encounter (Signed)
Returned patient's call and she wanted to know if she could take her Motrin 800 mg for her back and also the muscle relaxer.  I advised her that both were okay.  Her procedure is tomorrow at 8:00 am.  All questions were answered.

## 2013-02-22 ENCOUNTER — Ambulatory Visit (AMBULATORY_SURGERY_CENTER): Payer: 59 | Admitting: Internal Medicine

## 2013-02-22 ENCOUNTER — Encounter: Payer: Self-pay | Admitting: Internal Medicine

## 2013-02-22 VITALS — BP 118/80 | HR 62 | Temp 97.6°F | Resp 15 | Ht 69.0 in | Wt 214.0 lb

## 2013-02-22 DIAGNOSIS — Z8371 Family history of colonic polyps: Secondary | ICD-10-CM

## 2013-02-22 DIAGNOSIS — Z83719 Family history of colon polyps, unspecified: Secondary | ICD-10-CM

## 2013-02-22 DIAGNOSIS — Z1211 Encounter for screening for malignant neoplasm of colon: Secondary | ICD-10-CM

## 2013-02-22 MED ORDER — SODIUM CHLORIDE 0.9 % IV SOLN: 500.0000 mL | INTRAVENOUS | Status: DC

## 2013-02-22 NOTE — Progress Notes (Signed)
Patient did not experience any of the following events: a burn prior to discharge; a fall within the facility; wrong site/side/patient/procedure/implant event; or a hospital transfer or hospital admission upon discharge from the facility. (G8907) Patient did not have preoperative order for IV antibiotic SSI prophylaxis. (G8918)  

## 2013-02-22 NOTE — Op Note (Addendum)
 [facility] [address] [place], [address]   COLONOSCOPY PROCEDURE REPORT  PATIENT: Hannah [name], Hannah [name]  MR#: [id number] BIRTHDATE: [birth year] , 52  yrs. old GENDER: Female ENDOSCOPIST: Hart Carwin, MD REFERRED AO:ZHYQMV colonoscopy PROCEDURE DATE:  02/22/2013 PROCEDURE:   Colonoscopy, screening First Screening Colonoscopy - Avg.  risk and is 50 yrs.  old or older - No.  Prior Negative Screening - Now for repeat screening. Inadequate prep  History of Adenoma - Now for follow-up colonoscopy & has been > or = to 3 yrs.  N/A  Polyps Removed Today? No. Recommend repeat exam, <10 yrs? No. ASA CLASS:   Class II INDICATIONS:Patient's family history of colon polyps and father and sister with colon polyps,colon 2006, 02/2009- unable to see cecal pouch. MEDICATIONS: MAC sedation, administered by CRNA and propofol (Diprivan) 400mg  IV  DESCRIPTION OF PROCEDURE:   After the risks benefits and alternatives of the procedure were thoroughly explained, informed consent was obtained.  A digital rectal exam revealed no abnormalities of the rectum.   The LB [id number]-H190 [id number]  endoscope was introduced through the anus and advanced to the cecum, which was identified by both the appendix and ileocecal valve. No adverse events experienced.   The quality of the prep was Moviprep fair The instrument was then slowly withdrawn as the colon was fully examined.      COLON FINDINGS: A normal appearing cecum, ileocecal valve, and appendiceal orifice were identified.  The ascending, hepatic flexure, transverse, splenic flexure, descending, sigmoid colon and rectum appeared unremarkable. The colon was tortuous and redundant, difficult exam, large amount of liquid bowl content encountered in the right colon No polyps or cancers were seen.  Retroflexed views revealed no abnormalities. The time to cecum=24 minutes 13 seconds. Withdrawal time=6 minutes 02 seconds.  The scope was  withdrawn and the procedure completed. COMPLICATIONS: There were no complications.  ENDOSCOPIC IMPRESSION: Normal colon , redundant tortuous colon, no polyps  RECOMMENDATIONS: High fiber diet recall 7-10 years, suggest adult scope, double prep   eSigned:  Hart Carwin, MD 02/22/2013 8:59 AM Revised: 02/22/2013 8:59 AM  cc:

## 2013-02-22 NOTE — Patient Instructions (Addendum)
YOU HAD AN ENDOSCOPIC PROCEDURE TODAY AT THE [facility]: Refer to the procedure report that was given to you for any specific questions about what was found during the examination.  If the procedure report does not answer your questions, please call your gastroenterologist to clarify.  If you requested that your care partner not be given the details of your procedure findings, then the procedure report has been included in a sealed envelope for you to review at your convenience later.  YOU SHOULD EXPECT: Some feelings of bloating in the abdomen. Passage of more gas than usual.  Walking can help get rid of the air that was put into your GI tract during the procedure and reduce the bloating. If you had a lower endoscopy (such as a colonoscopy or flexible sigmoidoscopy) you may notice spotting of blood in your stool or on the toilet paper. If you underwent a bowel prep for your procedure, then you may not have a normal bowel movement for a few days.  DIET: Your first meal following the procedure should be a light meal and then it is ok to progress to your normal diet.  A half-sandwich or bowl of soup is an example of a good first meal.  Heavy or fried foods are harder to digest and may make you feel nauseous or bloated.  Likewise meals heavy in dairy and vegetables can cause extra gas to form and this can also increase the bloating.  Drink plenty of fluids but you should avoid alcoholic beverages for 24 hours.  ACTIVITY: Your care partner should take you home directly after the procedure.  You should plan to take it easy, moving slowly for the rest of the day.  You can resume normal activity the day after the procedure however you should NOT DRIVE or use heavy machinery for 24 hours (because of the sedation medicines used during the test).    SYMPTOMS TO REPORT IMMEDIATELY: A gastroenterologist can be reached at any hour.  During normal business hours, 8:30 AM to 5:00 PM Monday through Friday,  call [phone].  After hours and on weekends, please call the GI answering service at [phone] who will take a message and have the physician on call contact you.   Following lower endoscopy (colonoscopy or flexible sigmoidoscopy):  Excessive amounts of blood in the stool  Significant tenderness or worsening of abdominal pains  Swelling of the abdomen that is new, acute  Fever of 100F or higher    FOLLOW UP: If any biopsies were taken you will be contacted by phone or by letter within the next 1-3 weeks.  Call your gastroenterologist if you have not heard about the biopsies in 3 weeks.  Our staff will call the home number listed on your records the next business day following your procedure to check on you and address any questions or concerns that you may have at that time regarding the information given to you following your procedure. This is a courtesy call and so if there is no answer at the home number and we have not heard from you through the emergency physician on call, we will assume that you have returned to your regular daily activities without incident.  SIGNATURES/CONFIDENTIALITY: You and/or your care partner have signed paperwork which will be entered into your electronic medical record.  These signatures attest to the fact that that the information above on your After Visit Summary has been reviewed and is understood.  Full responsibility of the confidentiality   of this discharge information lies with you and/or your care-partner.  Resume medication.Information given on high fiber diet with discharge instructions.

## 2013-02-23 ENCOUNTER — Telehealth: Payer: Self-pay

## 2013-02-23 NOTE — Telephone Encounter (Signed)
Left message on answering machine. 

## 2013-04-13 ENCOUNTER — Telehealth: Payer: Self-pay | Admitting: Internal Medicine

## 2013-04-13 NOTE — Telephone Encounter (Signed)
Pt request refill; of albuterol (VENTOLIN HFA) 108 (90 BASE) MCG/ACT inhaler [facility]/ [address]

## 2013-04-14 ENCOUNTER — Other Ambulatory Visit: Payer: Self-pay | Admitting: Internal Medicine

## 2013-04-14 NOTE — Telephone Encounter (Signed)
Electronic message sent to [facility] and waiting on answer.  See electronic message from the pharmacy.

## 2013-04-14 NOTE — Telephone Encounter (Signed)
Cannot tell from the chart that you have prescribed or filled this medication.  Please advise.  Thanks!

## 2013-04-14 NOTE — Telephone Encounter (Signed)
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 Ok to refill x 1   Further refills  Require OV to discuss

## 2013-08-08 ENCOUNTER — Other Ambulatory Visit: Payer: Self-pay

## 2013-08-09 ENCOUNTER — Other Ambulatory Visit (INDEPENDENT_AMBULATORY_CARE_PROVIDER_SITE_OTHER): Payer: 59

## 2013-08-09 DIAGNOSIS — Z0000 Encounter for general adult medical examination without abnormal findings: Secondary | ICD-10-CM

## 2013-08-09 LAB — CBC WITH DIFFERENTIAL/PLATELET
BASOS ABS: 0 10*3/uL (ref 0.0–0.1)
Basophils Relative: 0.3 % (ref 0.0–3.0)
EOS ABS: 0.1 10*3/uL (ref 0.0–0.7)
Eosinophils Relative: 1.5 % (ref 0.0–5.0)
HCT: 45 % (ref 36.0–46.0)
HEMOGLOBIN: 14.5 g/dL (ref 12.0–15.0)
LYMPHS ABS: 2.9 10*3/uL (ref 0.7–4.0)
LYMPHS PCT: 43.5 % (ref 12.0–46.0)
MCHC: 32.2 g/dL (ref 30.0–36.0)
MCV: 86.7 fl (ref 78.0–100.0)
MONO ABS: 0.5 10*3/uL (ref 0.1–1.0)
Monocytes Relative: 7 % (ref 3.0–12.0)
NEUTROS ABS: 3.1 10*3/uL (ref 1.4–7.7)
Neutrophils Relative %: 47.7 % (ref 43.0–77.0)
Platelets: 269 10*3/uL (ref 150.0–400.0)
RBC: 5.2 Mil/uL — ABNORMAL HIGH (ref 3.87–5.11)
RDW: 13.6 % (ref 11.5–14.6)
WBC: 6.6 10*3/uL (ref 4.5–10.5)

## 2013-08-09 LAB — BASIC METABOLIC PANEL
BUN: 15 mg/dL (ref 6–23)
CALCIUM: 10 mg/dL (ref 8.4–10.5)
CO2: 30 meq/L (ref 19–32)
Chloride: 104 mEq/L (ref 96–112)
Creatinine, Ser: 1.1 mg/dL (ref 0.4–1.2)
GFR: 67.6 mL/min (ref >60.00)
GLUCOSE: 98 mg/dL (ref 70–99)
Potassium: 4.8 mEq/L (ref 3.5–5.1)
SODIUM: 140 meq/L (ref 135–145)

## 2013-08-09 LAB — HEPATIC FUNCTION PANEL
ALBUMIN: 4.3 g/dL (ref 3.5–5.2)
ALT: 29 U/L (ref 0–35)
AST: 24 U/L (ref 0–37)
Alkaline Phosphatase: 66 U/L (ref 39–117)
Bilirubin, Direct: 0 mg/dL (ref 0.0–0.3)
Total Bilirubin: 0.5 mg/dL (ref 0.3–1.2)
Total Protein: 8.1 g/dL (ref 6.0–8.3)

## 2013-08-09 LAB — LIPID PANEL
CHOLESTEROL: 197 mg/dL (ref 0–200)
HDL: 47.1 mg/dL (ref >39.00)
LDL Cholesterol: 138 mg/dL — ABNORMAL HIGH (ref 0–99)
TRIGLYCERIDES: 61 mg/dL (ref 0.0–149.0)
Total CHOL/HDL Ratio: 4
VLDL: 12.2 mg/dL (ref 0.0–40.0)

## 2013-08-09 LAB — TSH: TSH: 1.42 u[IU]/mL (ref 0.35–5.50)

## 2013-08-15 ENCOUNTER — Encounter: Payer: Self-pay | Admitting: Internal Medicine

## 2013-08-15 ENCOUNTER — Ambulatory Visit (INDEPENDENT_AMBULATORY_CARE_PROVIDER_SITE_OTHER): Payer: 59 | Admitting: Internal Medicine

## 2013-08-15 VITALS — BP 112/80 | HR 80 | Temp 98.5°F | Ht 67.5 in | Wt 219.0 lb

## 2013-08-15 DIAGNOSIS — Z23 Encounter for immunization: Secondary | ICD-10-CM

## 2013-08-15 DIAGNOSIS — J45909 Unspecified asthma, uncomplicated: Secondary | ICD-10-CM

## 2013-08-15 DIAGNOSIS — Z0000 Encounter for general adult medical examination without abnormal findings: Secondary | ICD-10-CM

## 2013-08-15 NOTE — Progress Notes (Signed)
Pre visit review using our clinic review tool, if applicable. No additional management support is needed unless otherwise documented below in the visit note.   Chief Complaint  Patient presents with  . Annual Exam    HPI: Patient comes in today for Preventive Health Care visit  As needed inhaler .   Not currently much of a problem Sees gyne and oncologist and told doing well.  utd on mammogram  In December  UTD on colonoscopy.  ROS:  GEN/ HEENT: No fever, significant weight changes sweats headaches vision problems hearing changes, CV/ PULM; No chest pain shortness of breath cough, syncope,edema  change in exercise tolerance. GI /GU: No adominal pain, vomiting, change in bowel habits. No blood in the stool. No significant GU symptoms. SKIN/HEME: ,no acute skin rashes suspicious lesions or bleeding. No lymphadenopathy, nodules, masses.  NEURO/ PSYCH:  No neurologic signs such as weakness numbness. No depression anxiety. IMM/ Allergy: No unusual infections.  Allergy .   REST of 12 system review negative except as per HPI   Past Medical History  Diagnosis Date  . Chest pain, atypical   . Rhinitis   . Nevus, atypical   . Chronic constipation   . Osteochondroma   . Asthma   . Uterine cancer   . H/O: hysterectomy 2011    presented with irregular bleeding  . ICP (infantile cerebral palsy)     elevated  . OSTEOCHONDROMA 10/10/2008    Qualifier: Diagnosis of  By: Ronnald Ramp CMA, Chemira    . Iron deficiency anemia   . NEOPLASM, MALIGNANT, UTERUS, HX OF 01/06/2010    Qualifier: Diagnosis of  By: Regis Bill MD, Standley Brooking   . MENOPAUSE, SURGICAL 01/06/2010    Qualifier: Diagnosis of  By: Regis Bill MD, Standley Brooking     Family History  Problem Relation Age of Onset  . Hypertension Mother     deceased  . Alcohol abuse Mother   . Hypertension Sister   . Diabetes Father     died from complications of dm, amputation and sepsis in his 61's   . Colon polyps    . Breast cancer Maternal Aunt      History   Social History  . Marital Status: Married    Spouse Name: N/A    Number of Children: N/A  . Years of Education: N/A   Social History Main Topics  . Smoking status: Never Smoker   . Smokeless tobacco: None  . Alcohol Use: Yes     Comment: socially  . Drug Use: No  . Sexual Activity: None   Other Topics Concern  . 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    She does not smoke cigarettes or use recreational drugs.     She drinks alcohol on rare occasions with 1 glass of wine. Approx. Once a month.  She does not do regular exercise.   recently    HH of 3 -4hysband and 34 yo son  Has exchange student from [place] now    Pet poodle   Husband is a smoker   [name] is married with 3 children, ages  One fr college another on her own     Outpatient Encounter Prescriptions as of 08/15/2013  Medication Sig  . Biotin 5 MG TABS Take by mouth.    Marland Kitchen  fish oil-omega-3 fatty acids 1000 MG capsule Take 2 g by mouth daily.    Marland Kitchen MOVIPREP 100 G SOLR Take 1 kit (100 g total) by mouth once.  . MULTIPLE VITAMIN PO Take by mouth.    . Probiotic Product (PROBIOTIC FORMULA PO) Take by mouth.    . Tafluprost (ZIOPTAN) 0.0015 % SOLN Apply 1 drop to eye daily.  . VENTOLIN HFA 108 (90 BASE) MCG/ACT inhaler USE 2 PUFFS EVERY 4 TO 6 HOURS AS NEEDED FOR SHORTNESS OF BREATH    EXAM:  BP 112/80  Pulse 80  Temp(Src) 98.5 F (36.9 C) (Oral)  Ht 5' 7.5" (1.715 m)  Wt 219 lb (99.338 kg)  BMI 33.77 kg/m2  Body mass index is 33.77 kg/(m^2).  Physical Exam: Vital signs reviewed DEY:CXKG is a well-developed well-nourished alert cooperative   female who appears her stated age in no acute distress.  HEENT: normocephalic atraumatic , Eyes: PERRL EOM's full, conjunctiva clear, Nares: paten,t no deformity discharge or tenderness., Ears:  no deformity EAC's clear TMs with normal landmarks. Mouth: clear OP, no lesions, edema.  Moist mucous membranes. Dentition in adequate repair. NECK: supple without masses, thyromegaly or bruits. CHEST/PULM:  Clear to auscultation and percussion breath sounds equal no wheeze , rales or rhonchi. No chest wall deformities or tenderness. Breast: normal by inspection . No dimpling, discharge, masses, tenderness or discharge . CV: PMI is nondisplaced, S1 S2 no gallops, murmurs, rubs. Peripheral pulses are full without delay.No JVD .  ABDOMEN: Bowel sounds normal nontender  No guard or rebound, no hepato splenomegal no CVA tenderness.   Extremtities:  No clubbing cyanosis or edema, no acute joint swelling or redness no focal atrophy NEURO:  Oriented x3, cranial nerves 3-12 appear to be intact, no obvious focal weakness,gait within normal limits no abnormal reflexes or asymmetrical SKIN: No acute rashes normal turgor, color, no bruising or petechiae. PSYCH: Oriented, good eye contact, no obvious depression anxiety, cognition and judgment appear normal. LN: no cervical axillary inguinal adenopathy  Lab Results  Component Value Date   WBC 6.6 08/09/2013   HGB 14.5 08/09/2013   HCT 45.0 08/09/2013   PLT 269.0 08/09/2013   GLUCOSE 98 08/09/2013   CHOL 197 08/09/2013   TRIG 61.0 08/09/2013   HDL 47.10 08/09/2013   LDLDIRECT 137.9 10/10/2008   LDLCALC 138* 08/09/2013   ALT 29 08/09/2013   AST 24 08/09/2013   NA 140 08/09/2013   K 4.8 08/09/2013   CL 104 08/09/2013   CREATININE 1.1 08/09/2013   BUN 15 08/09/2013   CO2 30 08/09/2013   TSH 1.42 08/09/2013   HGBA1C 5.9 01/14/2010    ASSESSMENT AND PLAN:  Discussed the following assessment and plan:  Encounter for preventive health examination  Need for prophylactic vaccination against Streptococcus pneumoniae (pneumococcus) - Plan: Pneumococcal conjugate vaccine 13-valent  ASTHMA - stable mild intermittent using prn b=albuterol  Reviewed labs Counseled  regarding healthy nutrition, exercise, sleep, injury prevention, calcium vit d and healthy weight . askd about hcg hgh etc  Would avoid thsese  Try tracking to help with weight control.  Patient Care Team: Burnis Medin, MD as PCP - General Lafayette Dragon, MD (Gastroenterology) Alvino Chapel, MD as Attending Physician (Gynecology) Marvene Staff, MD (Obstetrics and Gynecology) Sherren Mocha, MD (Cardiology) Patient Instructions  Tracking to  Help with weight management .  Call for refill for inhaler as needed.  150 minutes of exercise weeks  ,  Lose weight  To healthy levels.  Avoid trans fats and processed foods;  Increase fresh fruits and veges to 5 servings per day. And avoid sweet beverages  Including tea and juice. Exam is good today.  preventive in a year.     Standley Brooking. Panosh M.D.

## 2013-08-15 NOTE — Progress Notes (Signed)
No chief complaint on file.   HPI: Patient comes in today for Preventive Health Care visit   No major change in health status since last visit . Health Maintenance  Topic Date Due  . Influenza Vaccine  01/20/2013  . Mammogram  05/28/2013  . Pap Smear  04/28/2014  . Tetanus/tdap  06/22/2014  . Colonoscopy  02/23/2020   Health Maintenance Review   ROS:  GEN/ HEENT: No fever, significant weight changes sweats headaches vision problems hearing changes, CV/ PULM; No chest pain shortness of breath cough, syncope,edema  change in exercise tolerance. GI /GU: No adominal pain, vomiting, change in bowel habits. No blood in the stool. No significant GU symptoms. SKIN/HEME: ,no acute skin rashes suspicious lesions or bleeding. No lymphadenopathy, nodules, masses.  NEURO/ PSYCH:  No neurologic signs such as weakness numbness. No depression anxiety. IMM/ Allergy: No unusual infections.  Allergy .   REST of 12 system review negative except as per HPI   Past Medical History  Diagnosis Date  . Chest pain, atypical   . Rhinitis   . Nevus, atypical   . Chronic constipation   . Osteochondroma   . Asthma   . Uterine cancer   . H/O: hysterectomy 2011    presented with irregular bleeding  . ICP (infantile cerebral palsy)     elevated  . OSTEOCHONDROMA 10/10/2008    Qualifier: Diagnosis of  By: Ronnald Ramp CMA, Chemira    . Iron deficiency anemia     Family History  Problem Relation Age of Onset  . Hypertension Mother     deceased  . Alcohol abuse Mother   . Hypertension Sister   . Diabetes Father     died from complications of dm, amputation and sepsis in his 21's   . Colon polyps    . Breast cancer Maternal Aunt     History   Social History  . Marital Status: Married    Spouse Name: N/A    Number of Children: N/A  . Years of Education: N/A   Social History Main Topics  . Smoking status: Never Smoker   . Smokeless tobacco: None  . Alcohol Use: Yes     Comment: socially  .  Drug Use: No  . Sexual Activity: None   Other Topics Concern  . 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    She does not smoke cigarettes or use recreational drugs.     She drinks alcohol on rare occasions with 1 glass of wine. Approx. Once a month.  She does not do regular exercise.   recently    HH of 3 hysband and 25 yo son    Pet poodle   Husband is a smoker   [name] is married with 3 children, ages  One fr college another on her own     Outpatient Encounter Prescriptions as of 08/15/2013  Medication Sig  . Biotin 5 MG TABS Take by mouth.    . fish oil-omega-3 fatty acids 1000 MG capsule Take 2 g by mouth daily.    Marland Kitchen MOVIPREP 100 G SOLR Take 1 kit (100 g total) by mouth once.  . MULTIPLE VITAMIN PO Take by mouth.    . Probiotic Product (PROBIOTIC FORMULA PO) Take by mouth.    . Tafluprost (  ZIOPTAN) 0.0015 % SOLN Apply 1 drop to eye daily.  . VENTOLIN HFA 108 (90 BASE) MCG/ACT inhaler USE 2 PUFFS EVERY 4 TO 6 HOURS AS NEEDED FOR SHORTNESS OF BREATH    EXAM:  There were no vitals taken for this visit.  There is no weight on file to calculate BMI.  Physical Exam: Vital signs reviewed IWO:EHOZ is a well-developed well-nourished alert cooperative   female who appears her stated age in no acute distress.  HEENT: normocephalic atraumatic , Eyes: PERRL EOM's full, conjunctiva clear, Nares: paten,t no deformity discharge or tenderness., Ears: no deformity EAC's clear TMs with normal landmarks. Mouth: clear OP, no lesions, edema.  Moist mucous membranes. Dentition in adequate repair. NECK: supple without masses, thyromegaly or bruits. CHEST/PULM:  Clear to auscultation and percussion breath sounds equal no wheeze , rales or rhonchi. No chest wall deformities or tenderness. CV: PMI is nondisplaced, S1 S2 no gallops, murmurs, rubs.  Peripheral pulses are full without delay.No JVD .  ABDOMEN: Bowel sounds normal nontender  No guard or rebound, no hepato splenomegal no CVA tenderness.  No hernia. Extremtities:  No clubbing cyanosis or edema, no acute joint swelling or redness no focal atrophy NEURO:  Oriented x3, cranial nerves 3-12 appear to be intact, no obvious focal weakness,gait within normal limits no abnormal reflexes or asymmetrical SKIN: No acute rashes normal turgor, color, no bruising or petechiae. PSYCH: Oriented, good eye contact, no obvious depression anxiety, cognition and judgment appear normal. LN: no cervical axillary inguinal adenopathy  Lab Results  Component Value Date   WBC 6.6 08/09/2013   HGB 14.5 08/09/2013   HCT 45.0 08/09/2013   PLT 269.0 08/09/2013   GLUCOSE 98 08/09/2013   CHOL 197 08/09/2013   TRIG 61.0 08/09/2013   HDL 47.10 08/09/2013   LDLDIRECT 137.9 10/10/2008   LDLCALC 138* 08/09/2013   ALT 29 08/09/2013   AST 24 08/09/2013   NA 140 08/09/2013   K 4.8 08/09/2013   CL 104 08/09/2013   CREATININE 1.1 08/09/2013   BUN 15 08/09/2013   CO2 30 08/09/2013   TSH 1.42 08/09/2013   HGBA1C 5.9 01/14/2010    ASSESSMENT AND PLAN:  Discussed the following assessment and plan:  Encounter for preventive health examination  Patient Care Team: Burnis Medin, MD as PCP - General Lafayette Dragon, MD (Gastroenterology) Alvino Chapel, MD as Attending Physician (Gynecology) Marvene Staff, MD (Obstetrics and Gynecology) Sherren Mocha, MD (Cardiology) There are no Patient Instructions on file for this visit.  Standley Brooking. Sacha Topor M.D.

## 2013-08-15 NOTE — Patient Instructions (Addendum)
Tracking to  Help with weight management .  Call for refill for inhaler as needed.  150 minutes of exercise weeks  ,  Lose weight  To healthy levels. Avoid trans fats and processed foods;  Increase fresh fruits and veges to 5 servings per day. And avoid sweet beverages  Including tea and juice. Exam is good today.  preventive in a year.

## 2013-11-03 ENCOUNTER — Telehealth: Payer: Self-pay | Admitting: *Deleted

## 2013-11-03 NOTE — Telephone Encounter (Signed)
Called spoke with Erasmo Downer, RN  Regarding new pt referral per Dr. Alycia Rossetti pt should continue to follow up with Dr. Garwin Brothers office every 62months, continue to observe.

## 2014-03-27 ENCOUNTER — Ambulatory Visit (INDEPENDENT_AMBULATORY_CARE_PROVIDER_SITE_OTHER): Payer: 59 | Admitting: Internal Medicine

## 2014-03-27 ENCOUNTER — Encounter: Payer: Self-pay | Admitting: Internal Medicine

## 2014-03-27 VITALS — BP 110/80 | Temp 97.9°F | Ht 67.5 in | Wt 225.0 lb

## 2014-03-27 DIAGNOSIS — R1032 Left lower quadrant pain: Secondary | ICD-10-CM | POA: Insufficient documentation

## 2014-03-27 DIAGNOSIS — Z2821 Immunization not carried out because of patient refusal: Secondary | ICD-10-CM

## 2014-03-27 DIAGNOSIS — R1030 Lower abdominal pain, unspecified: Secondary | ICD-10-CM

## 2014-03-27 NOTE — Patient Instructions (Signed)
Exam is good  Uncertain cause  Of the groin pain  Groin pain can be referred from kidney hip or hernia or strain but Idont see evidence of this on exam today. I dont feel any mass  Or swollen  lymph gland   Get a good exm from dr Garwin Brothers to make sure not from a GYNE cause   Then contact us about next step if continuing .  Consider pelvic scan and hip x ray .   sometime itching can be from nerve irritation or moist environment  And pressure .

## 2014-03-27 NOTE — Progress Notes (Signed)
Pre visit review using our clinic review tool, if applicable. No additional management support is needed unless otherwise documented below in the visit note.  Chief Complaint  Patient presents with  . Abdominal Pain    Lower left abdomen.  Has had a dull pain for 8 wks.    HPI: Patient Hannah [name]  comes in today for SDA for  new problem evaluation. Insidious onset about 2 months ago dull pain in the left lower point area waxing and waning of her 8 weeks without specific insight turgor is triggers or associated symptoms. Denies any specific injury unusual exercises just started getting more exercise over the last week or 2.  No hematuria UTI fever change in bowel habits.   No radiation down the leg no lateral hip pain. Noted some discoloration and itching over the left inguinal area looks darker but no mass no worsening with coughing or sneezing. Asks if the symptoms could be from stress ROS: See pertinent positives and negatives per HPI. Has appointment with her GYN in a few weeks Dr. Garwin Brothers history of hysterectomy   Past Medical History  Diagnosis Date  . Chest pain, atypical   . Rhinitis   . Nevus, atypical   . Chronic constipation   . Osteochondroma   . Asthma   . Uterine cancer   . H/O: hysterectomy 2011    presented with irregular bleeding  . ICP (infantile cerebral palsy)     elevated  . OSTEOCHONDROMA 10/10/2008    Qualifier: Diagnosis of  By: Ronnald Ramp CMA, Chemira    . Iron deficiency anemia   . NEOPLASM, MALIGNANT, UTERUS, HX OF 01/06/2010    Qualifier: Diagnosis of  By: Regis Bill MD, Standley Brooking   . MENOPAUSE, SURGICAL 01/06/2010    Qualifier: Diagnosis of  By: Regis Bill MD, Standley Brooking     Family History  Problem Relation Age of Onset  . Hypertension Mother     deceased  . Alcohol abuse Mother   . Hypertension Sister   . Diabetes Father     died from complications of dm, amputation and sepsis in his 56's   . Colon polyps    . Breast cancer Maternal Aunt      History   Social History  . Marital Status: Married    Spouse Name: N/A    Number of Children: N/A  . Years of Education: N/A   Social History Main Topics  . Smoking status: Never Smoker   . Smokeless tobacco: None  . Alcohol Use: Yes     Comment: socially  . Drug Use: No  . Sexual Activity: None   Other Topics Concern  . 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    She does not smoke cigarettes or use recreational drugs.     She drinks alcohol on rare occasions with 1 glass of wine. Approx. Once a month.  She does not do regular exercise.   recently    HH of 3 -4hysband and 50 yo son  Has exchange student from [place] now    Pet poodle   Husband is a smoker   [name] is married with 3 children, ages  One fr college another on her own     Outpatient Encounter Prescriptions as of 03/27/2014  Medication Sig  .  Biotin 5 MG TABS Take by mouth.    . fish oil-omega-3 fatty acids 1000 MG capsule Take 2 g by mouth daily.    . MULTIPLE VITAMIN PO Take by mouth.    . Probiotic Product (PROBIOTIC FORMULA PO) Take by mouth.    . Tafluprost (ZIOPTAN) 0.0015 % SOLN Apply 1 drop to eye daily.  . VENTOLIN HFA 108 (90 BASE) MCG/ACT inhaler USE 2 PUFFS EVERY 4 TO 6 HOURS AS NEEDED FOR SHORTNESS OF BREATH  . MOVIPREP 100 G SOLR Take 1 kit (100 g total) by mouth once.    EXAM:  BP 110/80  Temp(Src) 97.9 F (36.6 C) (Oral)  Ht 5' 7.5" (1.715 m)  Wt 225 lb (102.059 kg)  BMI 34.70 kg/m2  Body mass index is 34.7 kg/(m^2).  GENERAL: vitals reviewed and listed above, alert, oriented, appears well hydrated and in no acute distress HEENT: atraumatic, conjunctiva  clear, no obvious abnormalities on inspection of external nose and ears NECK: no obvious masses on inspection palpation  LUNGS: clear to auscultation bilaterally, no  wheezes, rales or rhonchi, good air movement CV: HRRR, no clubbing cyanosis or  peripheral edema nl cap refill  Abdomen soft without organomegaly guarding or rebound well-healed low transverse scar area of concern is along the left inguinal ligament there is some diffuse dark in discoloration with no mass effect adenopathy or specific point tenderness. Range of motion in hips numbering out any pain there is no psoas sign. Her gait is normal. No CVA tenderness MS: moves all extremities without noticeable focal  abnormality PSYCH: pleasant and cooperative, no obvious depression or anxiety  ASSESSMENT AND PLAN:  Discussed the following assessment and plan:  Influenza vaccination declined  Left groin pain - Uncertain cause no alarm symptoms except no explanation. He appointment with GYN and have her address any abnormalities related to the inguinal area Even though not related to exercise still could be a strain I don't feel a hernia doubt if it's related to internal organs discussed possibility of CT pelvic scans but would not proceed with this at this time.  Followup or call after evaluation it can attenuate and progressive to new her physical activity as tolerated -Patient advised to return or notify health care team  if symptoms worsen ,persist or new concerns arise.  Patient Instructions  Exam is good  Uncertain cause  Of the groin pain  Groin pain can be referred from kidney hip or hernia or strain but Idont see evidence of this on exam today. I dont feel any mass  Or swollen  lymph gland   Get a good exm from dr Garwin Brothers to make sure not from a GYNE cause   Then contact us about next step if continuing .  Consider pelvic scan and hip x ray .   sometime itching can be from nerve irritation or moist environment  And pressure .        Standley Brooking. Panosh M.D.

## 2014-04-23 ENCOUNTER — Encounter: Payer: Self-pay | Admitting: Internal Medicine

## 2014-06-13 ENCOUNTER — Emergency Department (HOSPITAL_COMMUNITY)
Admission: EM | Admit: 2014-06-13 | Discharge: 2014-06-13 | Disposition: A | Discharge status: Home / Self Care | Payer: 59 | Source: Home / Self Care | Attending: Emergency Medicine | Admitting: Emergency Medicine

## 2014-06-13 ENCOUNTER — Encounter (HOSPITAL_COMMUNITY): Payer: Self-pay

## 2014-06-13 ENCOUNTER — Ambulatory Visit (HOSPITAL_COMMUNITY): Payer: 59 | Attending: Emergency Medicine

## 2014-06-13 DIAGNOSIS — M25512 Pain in left shoulder: Secondary | ICD-10-CM | POA: Diagnosis not present

## 2014-06-13 DIAGNOSIS — M7582 Other shoulder lesions, left shoulder: Secondary | ICD-10-CM

## 2014-06-13 DIAGNOSIS — M25519 Pain in unspecified shoulder: Secondary | ICD-10-CM

## 2014-06-13 HISTORY — DX: Unspecified glaucoma: H40.9

## 2014-06-13 MED ORDER — METHYLPREDNISOLONE ACETATE 40 MG/ML IJ SUSP
INTRAMUSCULAR | Status: AC
  Filled 2014-06-13: qty 5

## 2014-06-13 MED ORDER — MELOXICAM 15 MG PO TABS: 15.0000 mg | ORAL_TABLET | Freq: Every day | ORAL | Status: DC

## 2014-06-13 MED ORDER — LIDOCAINE HCL 2 % IJ SOLN
INTRAMUSCULAR | Status: AC
  Filled 2014-06-13: qty 20

## 2014-06-13 MED ORDER — HYDROCODONE-ACETAMINOPHEN 5-325 MG PO TABS: ORAL_TABLET | ORAL | Status: DC

## 2014-06-13 MED ORDER — METHYLPREDNISOLONE ACETATE 40 MG/ML IJ SUSP
40.0000 mg | Freq: Once | INTRAMUSCULAR | Status: AC
  Administered 2014-06-13: 40 mg via INTRA_ARTICULAR

## 2014-06-13 NOTE — ED Notes (Signed)
C/o pain in her left arm and shoulder x past 3 days. Denies injury, denies loss of function , or numbness. NAD. No relief w motrin

## 2014-06-13 NOTE — Discharge Instructions (Signed)

## 2014-06-13 NOTE — ED Provider Notes (Signed)
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 She denies any injury. The pain is located posteriorly and extends to the proximal upper arm. There is no swelling. She has a full range of motion of the shoulder with some pain on abduction and flexion. There is no pain radiating below the elbow, no numbness, tingling, weakness. The pain radiates up into the treat trapezius ridge, and she has some pain with movement of her neck as well.  Review of Systems   Other than as noted above, the patient denies any of the following symptoms: Systemic:  No fevers or chills. Musculoskeletal:  No joint pain, arthritis, swelling, back pain, or neck pain. No history of arthritis.  Neurological:  No muscular weakness or paresthesia.  [place]   Past medical history, family history, social history, meds, and allergies were reviewed.    Physical Examination     Vital signs:  BP 122/85 mmHg  Pulse 75  Temp(Src) 98.8 F (37.1 C) (Oral)  Resp 16  SpO2 98% Gen:  Alert and oriented times 3.  In no distress. Musculoskeletal: There was pain to palpation over the supraspinatus fossa. The shoulder has a full range of motion with minimal pain. Neer test was negative.  Hawkins test was positive.  Empty cans test was positive.  Otherwise, all joints had a full a ROM with no swelling, bruising or deformity.  No edema, pulses full. Extremities were warm and pink.  Capillary refill was brisk.  Skin:  Clear, warm and dry.  No rash. Neuro:  Alert and oriented times 3.  Muscle strength was normal.  Sensation was intact to light touch.   Radiology   Dg Shoulder Left  06/13/2014   CLINICAL DATA:  Left shoulder pain for 3 days, no known injury, initial encounter  EXAM: LEFT SHOULDER - 2+ VIEW  COMPARISON:  None.  FINDINGS: No acute fracture or dislocation is noted. Mild  degenerative changes are noted in the acromioclavicular joint. There are changes consistent with either an os acromiale or prior acromion fracture with nonunion. No acute fracture is noted.  IMPRESSION: Chronic changes without acute abnormality.   Electronically Signed   By: Inez Catalina M.D.   On: 06/13/2014 15:25    I reviewed the images independently and personally and concur with the radiologist's findings.  Procedure Note:  Verbal informed consent was obtained from the patient.  Risks and benefits were outlined with the patient.  Patient understands and accepts these risks. A time out was called and the name of the procedure, the procedure site, and identity of the patient were confirmed verbally and by wristband.    The procedure was then performed as follows:  The posterior aspect of the shoulder was prepped with Betadine and alcohol, anesthetized with ethyl chloride spray, and then the subacromial space was entered with a 1/2 inch 27-gauge needle and 1 mL of Depo-Medrol 40 mg +2 mL of 2% Xylocaine were injected.  The patient tolerated the procedure well without any immediate complications.     Assessment   The primary encounter diagnosis was Rotator cuff tendonitis, left. A diagnosis of Shoulder pain was also pertinent to this visit.   She has a bone spur involving the acromioclavicular joint and will need orthopedic follow-up.  Plan     1.  Meds:  The following meds were prescribed:  Discharge Medication List as of 06/13/2014  3:48 PM    START taking these medications   Details  HYDROcodone-acetaminophen (NORCO/VICODIN) 5-325 MG per tablet 1 to 2 tabs every 4 to 6 hours as needed for pain., Print    meloxicam (MOBIC) 15 MG tablet Take 1 tablet (15 mg total) by mouth daily., Starting 06/13/2014, Until Discontinued, Normal        2.  Patient Education/Counseling:  The patient was given appropriate handouts, self care instructions, and instructed in symptomatic relief.  Given  shoulder exercises to do.  3.  Follow up:  The patient was told to follow up here if no better in 3 to 4 days, or sooner if becoming worse in any way, and given some red flag symptoms such as worsening pain or new neurological symptoms which would prompt immediate return.  Follow-up with Dr. Paralee Cancel within the next 2 weeks.     Harden Mo, MD 06/13/14 [phone]

## 2014-08-03 ENCOUNTER — Other Ambulatory Visit (INDEPENDENT_AMBULATORY_CARE_PROVIDER_SITE_OTHER): Payer: 59

## 2014-08-03 DIAGNOSIS — Z0000 Encounter for general adult medical examination without abnormal findings: Secondary | ICD-10-CM

## 2014-08-03 LAB — LIPID PANEL
Cholesterol: 197 mg/dL (ref 0–200)
HDL: 56.6 mg/dL (ref >39.00)
LDL CALC: 132 mg/dL — AB (ref 0–99)
NonHDL: 140.4
TRIGLYCERIDES: 44 mg/dL (ref 0.0–149.0)
Total CHOL/HDL Ratio: 3
VLDL: 8.8 mg/dL (ref 0.0–40.0)

## 2014-08-03 LAB — HEPATIC FUNCTION PANEL
ALK PHOS: 74 U/L (ref 39–117)
ALT: 14 U/L (ref 0–35)
AST: 14 U/L (ref 0–37)
Albumin: 4.6 g/dL (ref 3.5–5.2)
BILIRUBIN DIRECT: 0.1 mg/dL (ref 0.0–0.3)
BILIRUBIN TOTAL: 0.5 mg/dL (ref 0.2–1.2)
Total Protein: 8.3 g/dL (ref 6.0–8.3)

## 2014-08-03 LAB — CBC WITH DIFFERENTIAL/PLATELET
Basophils Absolute: 0 10*3/uL (ref 0.0–0.1)
Basophils Relative: 0.4 % (ref 0.0–3.0)
EOS ABS: 0 10*3/uL (ref 0.0–0.7)
Eosinophils Relative: 0.3 % (ref 0.0–5.0)
HEMATOCRIT: 43.4 % (ref 36.0–46.0)
Hemoglobin: 14.5 g/dL (ref 12.0–15.0)
Lymphocytes Relative: 24.1 % (ref 12.0–46.0)
Lymphs Abs: 2.5 10*3/uL (ref 0.7–4.0)
MCHC: 33.3 g/dL (ref 30.0–36.0)
MCV: 83.7 fl (ref 78.0–100.0)
MONO ABS: 0.6 10*3/uL (ref 0.1–1.0)
Monocytes Relative: 5.6 % (ref 3.0–12.0)
Neutro Abs: 7.2 10*3/uL (ref 1.4–7.7)
Neutrophils Relative %: 69.6 % (ref 43.0–77.0)
Platelets: 257 10*3/uL (ref 150.0–400.0)
RBC: 5.18 Mil/uL — AB (ref 3.87–5.11)
RDW: 13.8 % (ref 11.5–15.5)
WBC: 10.3 10*3/uL (ref 4.0–10.5)

## 2014-08-03 LAB — BASIC METABOLIC PANEL
BUN: 14 mg/dL (ref 6–23)
CO2: 27 mEq/L (ref 19–32)
CREATININE: 0.98 mg/dL (ref 0.40–1.20)
Calcium: 10.4 mg/dL (ref 8.4–10.5)
Chloride: 105 mEq/L (ref 96–112)
GFR: 76.15 mL/min (ref >60.00)
Glucose, Bld: 99 mg/dL (ref 70–99)
POTASSIUM: 4.3 meq/L (ref 3.5–5.1)
Sodium: 140 mEq/L (ref 135–145)

## 2014-08-03 LAB — TSH: TSH: 1.18 u[IU]/mL (ref 0.35–4.50)

## 2014-08-10 ENCOUNTER — Ambulatory Visit (INDEPENDENT_AMBULATORY_CARE_PROVIDER_SITE_OTHER): Payer: 59 | Admitting: Internal Medicine

## 2014-08-10 ENCOUNTER — Encounter: Payer: Self-pay | Admitting: Internal Medicine

## 2014-08-10 VITALS — BP 112/80 | Temp 98.0°F | Ht 67.0 in | Wt 221.0 lb

## 2014-08-10 DIAGNOSIS — Z23 Encounter for immunization: Secondary | ICD-10-CM

## 2014-08-10 DIAGNOSIS — Z0000 Encounter for general adult medical examination without abnormal findings: Secondary | ICD-10-CM

## 2014-08-10 NOTE — Progress Notes (Signed)
Pre visit review using our clinic review tool, if applicable. No additional management support is needed unless otherwise documented below in the visit note.  Chief Complaint  Patient presents with  . Annual Exam    HPI: Patient  Hannah [name]  54 y.o. comes in today for Preventive Health Care visit  Since last visti  Has had a left shoulder injury work December     Ortho   Advised Shoulder   Surgery . [facility] .   Eyes monitoring for IOP  GYNE no problems at this time ? About hep c screen no exposures noted  ? Never tested  Health Maintenance  Topic Date Due  . MAMMOGRAM  05/28/2013  . PAP SMEAR  04/28/2014  . INFLUENZA VACCINE  02/21/2015 (Originally 01/20/2014)  . COLONOSCOPY  02/23/2020  . TETANUS/TDAP  08/10/2024   Health Maintenance Review LIFESTYLE:  Exercise:  job Tobacco/ETS: no Alcohol: no Sugar beverages: soda  One  A 2 per week Sleep: 6-7  Drug use: no Colonoscopy: 10 year  PAP: hyst  MAMMO:  Call back dec  And fu 4 months    ROS:  GEN/ HEENT: No fever, significant weight changes sweats headaches vision problems hearing changes, CV/ PULM; No chest pain shortness of breath cough, syncope,edema  change in exercise tolerance. GI /GU: No adominal pain, vomiting, change in bowel habits. No blood in the stool. No significant GU symptoms. SKIN/HEME: ,no acute skin rashes suspicious lesions or bleeding. No lymphadenopathy, nodules, masses.  NEURO/ PSYCH:  No neurologic signs such as weakness numbness. No depression anxiety. IMM/ Allergy: No unusual infections.  Allergy .   REST of 12 system review negative except as per HPI   Past Medical History  Diagnosis Date  . Chest pain, atypical   . Rhinitis   . Nevus, atypical   . Chronic constipation   . Osteochondroma   . Asthma   . Uterine cancer   . H/O: hysterectomy 2011    presented with irregular bleeding  . ICP (infantile cerebral palsy)     elevated  . OSTEOCHONDROMA 10/10/2008    Qualifier:  Diagnosis of  By: Ronnald Ramp CMA, Chemira    . Iron deficiency anemia   . NEOPLASM, MALIGNANT, UTERUS, HX OF 01/06/2010    Qualifier: Diagnosis of  By: Regis Bill MD, Standley Brooking   . MENOPAUSE, SURGICAL 01/06/2010    Qualifier: Diagnosis of  By: Regis Bill MD, Standley Brooking Glaucoma     Past Surgical History  Procedure Laterality Date  . Cesarean section    . Umbilical hernia repair    . Foot surgery    . Total vaginal hysterectomy  2011  . Tubal ligation    . Myomectomy      Family History  Problem Relation Age of Onset  . Hypertension Mother     deceased  . Alcohol abuse Mother   . Hypertension Sister   . Diabetes Father     died from complications of dm, amputation and sepsis in his 1's   . Colon polyps    . Breast cancer Maternal Aunt     History   Social History  . Marital Status: Married    Spouse Name: N/A  . Number of Children: N/A  . Years of Education: N/A   Social History Main Topics  . Smoking status: Never Smoker   . Smokeless tobacco: Not on file  . Alcohol Use: Yes     Comment: socially  . Drug Use:  No  . Sexual Activity: Not on file   Other Topics Concern  . 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    She does not smoke cigarettes or use recreational drugs.     She drinks alcohol on rare occasions with 1 glass of wine. Approx. Once a month.  She does not do regular exercise.   recently    HH of 3 -4hysband and 61 yo son  Has exchange student from [place] now    Pet poodle   Husband is a smoker   [name] is married with 3 children, ages  One fr college another on her own     Outpatient Encounter Prescriptions as of 08/10/2014  Medication Sig  . Biotin 5 MG TABS Take by mouth.    . fish oil-omega-3 fatty acids 1000 MG capsule Take 2 g by mouth daily.    Marland Kitchen HYDROcodone-acetaminophen (NORCO/VICODIN)  5-325 MG per tablet 1 to 2 tabs every 4 to 6 hours as needed for pain.  . meloxicam (MOBIC) 15 MG tablet Take 1 tablet (15 mg total) by mouth daily.  . MULTIPLE VITAMIN PO Take by mouth.    . Probiotic Product (PROBIOTIC FORMULA PO) Take by mouth.    . Tafluprost (ZIOPTAN) 0.0015 % SOLN Apply 1 drop to eye daily.  . VENTOLIN HFA 108 (90 BASE) MCG/ACT inhaler USE 2 PUFFS EVERY 4 TO 6 HOURS AS NEEDED FOR SHORTNESS OF BREATH  . MOVIPREP 100 G SOLR Take 1 kit (100 g total) by mouth once. (Patient not taking: Reported on 08/10/2014)    EXAM:  BP 112/80 mmHg  Temp(Src) 98 F (36.7 C) (Oral)  Ht 5' 7"  (1.702 m)  Wt 221 lb (100.245 kg)  BMI 34.61 kg/m2  Body mass index is 34.61 kg/(m^2).  Physical Exam: Vital signs reviewed IRC:VELF is a well-developed well-nourished alert cooperative    who appearsr stated age in no acute distress.  HEENT: normocephalic atraumatic , Eyes: PERRL EOM's full, conjunctiva clear, Nares: paten,t no deformity discharge or tenderness., Ears: no deformity EAC's clear TMs with normal landmarks. Mouth: clear OP, no lesions, edema.  Moist mucous membranes. Dentition in adequate repair. NECK: supple without masses, thyromegaly or bruits. CHEST/PULM:  Clear to auscultation and percussion breath sounds equal no wheeze , rales or rhonchi. No chest wall deformities or tenderness. Breast: normal by inspection . No dimpling, discharge, masses, tenderness or discharge . CV: PMI is nondisplaced, S1 S2 no gallops, murmurs, rubs. Peripheral pulses are full without delay.No JVD .  ABDOMEN: Bowel sounds normal nontender  No guard or rebound, no hepato splenomegal no CVA tenderness.  No hernia. Extremtities:  No clubbing cyanosis or edema, no acute joint swelling or redness no focal atrophy NEURO:  Oriented x3, cranial nerves 3-12 appear to be intact, no obvious focal weakness,gait within normal limits no abnormal reflexes or asymmetrical SKIN: No acute rashes normal turgor, color, no  bruising or petechiae. PSYCH: Oriented, good eye contact, no obvious depression anxiety, cognition and judgment appear normal. LN: no cervical axillary inguinal adenopathy  Lab Results  Component Value Date   WBC 10.3 08/03/2014   HGB 14.5 08/03/2014   HCT 43.4 08/03/2014   PLT 257.0 08/03/2014   GLUCOSE 99 08/03/2014   CHOL 197 08/03/2014   TRIG 44.0 08/03/2014  HDL 56.60 08/03/2014   LDLDIRECT 137.9 10/10/2008   LDLCALC 132* 08/03/2014   ALT 14 08/03/2014   AST 14 08/03/2014   NA 140 08/03/2014   K 4.3 08/03/2014   CL 105 08/03/2014   CREATININE 0.98 08/03/2014   BUN 14 08/03/2014   CO2 27 08/03/2014   TSH 1.18 08/03/2014   HGBA1C 5.9 01/14/2010   Reviewed labs  ASSESSMENT AND PLAN:  Discussed the following assessment and plan:  Visit for preventive health examination - utd  Need for Tdap vaccination - Plan: Tdap vaccine greater than or equal to 7yo IM Counseled regarding healthy nutrition, exercise, sleep, injury prevention, calcium vit d and healthy weight . Can get hep c screen next labs  Patient Care Team: Burnis Medin, MD as PCP - General Lafayette Dragon, MD (Gastroenterology) Marvene Staff, MD (Obstetrics and Gynecology) Blane Ohara, MD (Cardiology) Patient Instructions   Continue lifestyle intervention healthy eating and exercise . At next blood tests ask to put in order for hepc aby screen .  Check into shingles vaccine ( Zostavax) reimbursement or cost to you  and can return at any time if call ahead for injection. Usually at age 41 years .  Yearly check up       Why follow it? Research shows. . Those who follow the Mediterranean diet have a reduced risk of heart disease  . The diet is associated with a reduced incidence of Parkinson's and Alzheimer's diseases . People following the diet may have longer life expectancies and lower rates of chronic diseases  . The Dietary Guidelines for Americans recommends the Mediterranean diet as an  eating plan to promote health and prevent disease  What Is the Mediterranean Diet?  . Healthy eating plan based on typical foods and recipes of Mediterranean-style cooking . The diet is primarily a plant based diet; these foods should make up a majority of meals   Starches - Plant based foods should make up a majority of meals - They are an important sources of vitamins, minerals, energy, antioxidants, and fiber - Choose whole grains, foods high in fiber and minimally processed items  - Typical grain sources include wheat, oats, barley, corn, brown rice, bulgar, farro, millet, polenta, couscous  - Various types of beans include chickpeas, lentils, fava beans, black beans, white beans   Fruits  Veggies - Large quantities of antioxidant rich fruits & veggies; 6 or more servings  - Vegetables can be eaten raw or lightly drizzled with oil and cooked  - Vegetables common to the traditional Mediterranean Diet include: artichokes, arugula, beets, broccoli, brussel sprouts, cabbage, carrots, celery, collard greens, cucumbers, eggplant, kale, leeks, lemons, lettuce, mushrooms, okra, onions, peas, peppers, potatoes, pumpkin, radishes, rutabaga, shallots, spinach, sweet potatoes, turnips, zucchini - Fruits common to the Mediterranean Diet include: apples, apricots, avocados, cherries, clementines, dates, figs, grapefruits, grapes, melons, nectarines, oranges, peaches, pears, pomegranates, strawberries, tangerines  Fats - Replace butter and margarine with healthy oils, such as olive oil, canola oil, and tahini  - Limit nuts to no more than a handful a day  - Nuts include walnuts, almonds, pecans, pistachios, pine nuts  - Limit or avoid candied, honey roasted or heavily salted nuts - Olives are central to the [employer] - can be eaten whole or used in a variety of dishes   Meats Protein - Limiting red meat: no more than a few times a month - When eating red meat: choose lean cuts and keep the portion  to the size of deck of cards - Eggs: approx. 0 to 4 times a week  - Fish and lean poultry: at least 2 a week  - Healthy protein sources include, chicken, [place], lean beef, lamb - Increase intake of seafood such as tuna, salmon, trout, mackerel, shrimp, scallops - Avoid or limit high fat processed meats such as sausage and bacon  Dairy - Include moderate amounts of low fat dairy products  - Focus on healthy dairy such as fat free yogurt, skim milk, low or reduced fat cheese - Limit dairy products higher in fat such as whole or 2% milk, cheese, ice cream  Alcohol - Moderate amounts of red wine is ok  - No more than 5 oz daily for women (all ages) and men older than age 78  - No more than 10 oz of wine daily for men younger than 37  Other - Limit sweets and other desserts  - Use herbs and spices instead of salt to flavor foods  - Herbs and spices common to the traditional Mediterranean Diet include: basil, bay leaves, chives, cloves, cumin, fennel, garlic, lavender, marjoram, mint, oregano, parsley, pepper, rosemary, sage, savory, sumac, tarragon, thyme   It's not just a diet, it's a lifestyle:  . The Mediterranean diet includes lifestyle factors typical of those in the region  . Foods, drinks and meals are best eaten with others and savored . Daily physical activity is important for overall good health . This could be strenuous exercise like running and aerobics . This could also be more leisurely activities such as walking, housework, yard-work, or taking the stairs . Moderation is the key; a balanced and healthy diet accommodates most foods and drinks . Consider portion sizes and frequency of consumption of certain foods   Meal Ideas & Options:  . Breakfast:  o Whole wheat toast or whole wheat English muffins with peanut butter & hard boiled egg o Steel cut oats topped with apples & cinnamon and skim milk  o Fresh fruit: banana, strawberries, melon, berries, peaches  o Smoothies:  strawberries, bananas, greek yogurt, peanut butter o Low fat greek yogurt with blueberries and granola  o Egg white omelet with spinach and mushrooms o Breakfast couscous: whole wheat couscous, apricots, skim milk, cranberries  . Sandwiches:  o Hummus and grilled vegetables (peppers, zucchini, squash) on whole wheat bread   o Grilled chicken on whole wheat pita with lettuce, tomatoes, cucumbers or tzatziki  o Tuna salad on whole wheat bread: tuna salad made with greek yogurt, olives, red peppers, capers, green onions o Garlic rosemary lamb pita: lamb sauted with garlic, rosemary, salt & pepper; add lettuce, cucumber, greek yogurt to pita - flavor with lemon juice and black pepper  . Seafood:  o Mediterranean grilled salmon, seasoned with garlic, basil, parsley, lemon juice and black pepper o Shrimp, lemon, and spinach whole-grain pasta salad made with low fat greek yogurt  o Seared scallops with lemon orzo  o Seared tuna steaks seasoned salt, pepper, coriander topped with tomato mixture of olives, tomatoes, olive oil, minced garlic, parsley, green onions and cappers  . Meats:  o Herbed greek chicken salad with kalamata olives, cucumber, feta  o Red bell peppers stuffed with spinach, bulgur, lean ground beef (or lentils) & topped with feta   o Kebabs: skewers of chicken, tomatoes, onions, zucchini, squash  o [place] burgers: made with red onions, mint, dill, lemon juice, feta cheese topped with roasted red peppers . Vegetarian o Cucumber salad: cucumbers,  artichoke hearts, celery, red onion, feta cheese, tossed in olive oil & lemon juice  o Hummus and whole grain pita points with a greek salad (lettuce, tomato, feta, olives, cucumbers, red onion) o Lentil soup with celery, carrots made with vegetable broth, garlic, salt and pepper  o Tabouli salad: parsley, bulgur, mint, scallions, cucumbers, tomato, radishes, lemon juice, olive oil, salt and pepper.          Standley Brooking. Glenn Christo M.D.

## 2014-08-10 NOTE — Patient Instructions (Signed)
Continue lifestyle intervention healthy eating and exercise . At next blood tests ask to put in order for hepc aby screen .  Check into shingles vaccine ( Zostavax) reimbursement or cost to you  and can return at any time if call ahead for injection. Usually at age 54 years .  Yearly check up       Why follow it? Research shows. . Those who follow the Mediterranean diet have a reduced risk of heart disease  . The diet is associated with a reduced incidence of Parkinson's and Alzheimer's diseases . People following the diet may have longer life expectancies and lower rates of chronic diseases  . The Dietary Guidelines for Americans recommends the Mediterranean diet as an eating plan to promote health and prevent disease  What Is the Mediterranean Diet?  . Healthy eating plan based on typical foods and recipes of Mediterranean-style cooking . The diet is primarily a plant based diet; these foods should make up a majority of meals   Starches - Plant based foods should make up a majority of meals - They are an important sources of vitamins, minerals, energy, antioxidants, and fiber - Choose whole grains, foods high in fiber and minimally processed items  - Typical grain sources include wheat, oats, barley, corn, brown rice, bulgar, farro, millet, polenta, couscous  - Various types of beans include chickpeas, lentils, fava beans, black beans, white beans   Fruits  Veggies - Large quantities of antioxidant rich fruits & veggies; 6 or more servings  - Vegetables can be eaten raw or lightly drizzled with oil and cooked  - Vegetables common to the traditional Mediterranean Diet include: artichokes, arugula, beets, broccoli, brussel sprouts, cabbage, carrots, celery, collard greens, cucumbers, eggplant, kale, leeks, lemons, lettuce, mushrooms, okra, onions, peas, peppers, potatoes, pumpkin, radishes, rutabaga, shallots, spinach, sweet potatoes, turnips, zucchini - Fruits common to the Mediterranean  Diet include: apples, apricots, avocados, cherries, clementines, dates, figs, grapefruits, grapes, melons, nectarines, oranges, peaches, pears, pomegranates, strawberries, tangerines  Fats - Replace butter and margarine with healthy oils, such as olive oil, canola oil, and tahini  - Limit nuts to no more than a handful a day  - Nuts include walnuts, almonds, pecans, pistachios, pine nuts  - Limit or avoid candied, honey roasted or heavily salted nuts - Olives are central to the [employer] - can be eaten whole or used in a variety of dishes   Meats Protein - Limiting red meat: no more than a few times a month - When eating red meat: choose lean cuts and keep the portion to the size of deck of cards - Eggs: approx. 0 to 4 times a week  - Fish and lean poultry: at least 2 a week  - Healthy protein sources include, chicken, [place], lean beef, lamb - Increase intake of seafood such as tuna, salmon, trout, mackerel, shrimp, scallops - Avoid or limit high fat processed meats such as sausage and bacon  Dairy - Include moderate amounts of low fat dairy products  - Focus on healthy dairy such as fat free yogurt, skim milk, low or reduced fat cheese - Limit dairy products higher in fat such as whole or 2% milk, cheese, ice cream  Alcohol - Moderate amounts of red wine is ok  - No more than 5 oz daily for women (all ages) and men older than age 37  - No more than 10 oz of wine daily for men younger than 60  Other - Limit sweets and  other desserts  - Use herbs and spices instead of salt to flavor foods  - Herbs and spices common to the traditional Mediterranean Diet include: basil, bay leaves, chives, cloves, cumin, fennel, garlic, lavender, marjoram, mint, oregano, parsley, pepper, rosemary, sage, savory, sumac, tarragon, thyme   It's not just a diet, it's a lifestyle:  . The Mediterranean diet includes lifestyle factors typical of those in the region  . Foods, drinks and meals are best eaten  with others and savored . Daily physical activity is important for overall good health . This could be strenuous exercise like running and aerobics . This could also be more leisurely activities such as walking, housework, yard-work, or taking the stairs . Moderation is the key; a balanced and healthy diet accommodates most foods and drinks . Consider portion sizes and frequency of consumption of certain foods   Meal Ideas & Options:  . Breakfast:  o Whole wheat toast or whole wheat English muffins with peanut butter & hard boiled egg o Steel cut oats topped with apples & cinnamon and skim milk  o Fresh fruit: banana, strawberries, melon, berries, peaches  o Smoothies: strawberries, bananas, greek yogurt, peanut butter o Low fat greek yogurt with blueberries and granola  o Egg white omelet with spinach and mushrooms o Breakfast couscous: whole wheat couscous, apricots, skim milk, cranberries  . Sandwiches:  o Hummus and grilled vegetables (peppers, zucchini, squash) on whole wheat bread   o Grilled chicken on whole wheat pita with lettuce, tomatoes, cucumbers or tzatziki  o Tuna salad on whole wheat bread: tuna salad made with greek yogurt, olives, red peppers, capers, green onions o Garlic rosemary lamb pita: lamb sauted with garlic, rosemary, salt & pepper; add lettuce, cucumber, greek yogurt to pita - flavor with lemon juice and black pepper  . Seafood:  o Mediterranean grilled salmon, seasoned with garlic, basil, parsley, lemon juice and black pepper o Shrimp, lemon, and spinach whole-grain pasta salad made with low fat greek yogurt  o Seared scallops with lemon orzo  o Seared tuna steaks seasoned salt, pepper, coriander topped with tomato mixture of olives, tomatoes, olive oil, minced garlic, parsley, green onions and cappers  . Meats:  o Herbed greek chicken salad with kalamata olives, cucumber, feta  o Red bell peppers stuffed with spinach, bulgur, lean ground beef (or lentils) &  topped with feta   o Kebabs: skewers of chicken, tomatoes, onions, zucchini, squash  o [place] burgers: made with red onions, mint, dill, lemon juice, feta cheese topped with roasted red peppers . Vegetarian o Cucumber salad: cucumbers, artichoke hearts, celery, red onion, feta cheese, tossed in olive oil & lemon juice  o Hummus and whole grain pita points with a greek salad (lettuce, tomato, feta, olives, cucumbers, red onion) o Lentil soup with celery, carrots made with vegetable broth, garlic, salt and pepper  o Tabouli salad: parsley, bulgur, mint, scallions, cucumbers, tomato, radishes, lemon juice, olive oil, salt and pepper.

## 2015-03-12 ENCOUNTER — Encounter: Payer: Self-pay | Admitting: Internal Medicine

## 2015-06-10 ENCOUNTER — Other Ambulatory Visit: Payer: Self-pay | Admitting: Internal Medicine

## 2015-07-15 ENCOUNTER — Encounter: Payer: Self-pay | Admitting: Family Medicine

## 2015-07-15 ENCOUNTER — Ambulatory Visit (INDEPENDENT_AMBULATORY_CARE_PROVIDER_SITE_OTHER): Payer: 59 | Admitting: Family Medicine

## 2015-07-15 VITALS — BP 112/70 | HR 93 | Temp 97.8°F | Ht 67.0 in | Wt 216.0 lb

## 2015-07-15 DIAGNOSIS — H8110 Benign paroxysmal vertigo, unspecified ear: Secondary | ICD-10-CM | POA: Diagnosis not present

## 2015-07-15 MED ORDER — MECLIZINE HCL 25 MG PO TABS: 25.0000 mg | ORAL_TABLET | ORAL | Status: DC | PRN

## 2015-07-15 NOTE — Progress Notes (Signed)
Pre visit review using our clinic review tool, if applicable. No additional management support is needed unless otherwise documented below in the visit note. 

## 2015-07-15 NOTE — Progress Notes (Signed)
   Subjective:    Patient ID: Hannah [name], female    DOB: 11-23-1960, 55 y.o.   MRN: [id number]  HPI Here for the onset today of dizziness, experienced as the room spinning around her. She has had mild URI symptoms like congestion for 3 days. Today she feel okay when still ,but she gets dizzy when  she moves or gets up or down. No headache or blurred vi   Review of Systems  Constitutional: Negative.   HENT: Positive for congestion, ear pain and sinus pressure. Negative for postnasal drip and sore throat.   Eyes: Negative.   Respiratory: Negative.   Cardiovascular: Negative.   Gastrointestinal: Negative.   Neurological: Positive for dizziness. Negative for tremors, seizures, syncope, facial asymmetry, speech difficulty, weakness, light-headedness, numbness and headaches.       Objective:   Physical Exam  Constitutional: She is oriented to person, place, and time. She appears well-developed and well-nourished.  HENT:  Right Ear: External ear normal.  Left Ear: External ear normal.  Nose: Nose normal.  Mouth/Throat: Oropharynx is clear and moist.  Eyes: Conjunctivae are normal. Pupils are equal, round, and reactive to light.  Neck: No thyromegaly present.  Cardiovascular: Normal rate, regular rhythm, normal heart sounds and intact distal pulses.   Pulmonary/Chest: Effort normal and breath sounds normal. No respiratory distress. She has no wheezes. She has no rales.  Lymphadenopathy:    She has no cervical adenopathy.  Neurological: She is alert and oriented to person, place, and time. She has normal reflexes. No cranial nerve deficit. She exhibits normal muscle tone. Coordination normal.          Assessment & Plan:  Vertigo. Drink fluids, take Claritin daily. Use Meclizine prn

## 2015-09-18 ENCOUNTER — Other Ambulatory Visit (INDEPENDENT_AMBULATORY_CARE_PROVIDER_SITE_OTHER): Payer: 59

## 2015-09-18 DIAGNOSIS — Z0000 Encounter for general adult medical examination without abnormal findings: Secondary | ICD-10-CM | POA: Diagnosis not present

## 2015-09-18 LAB — CBC WITH DIFFERENTIAL/PLATELET
BASOS ABS: 0 10*3/uL (ref 0.0–0.1)
Basophils Relative: 0.4 % (ref 0.0–3.0)
EOS ABS: 0.2 10*3/uL (ref 0.0–0.7)
Eosinophils Relative: 3.3 % (ref 0.0–5.0)
HEMATOCRIT: 41.4 % (ref 36.0–46.0)
Hemoglobin: 13.8 g/dL (ref 12.0–15.0)
LYMPHS ABS: 3 10*3/uL (ref 0.7–4.0)
Lymphocytes Relative: 47.1 % — ABNORMAL HIGH (ref 12.0–46.0)
MCHC: 33.3 g/dL (ref 30.0–36.0)
MCV: 83.1 fl (ref 78.0–100.0)
MONOS PCT: 7.1 % (ref 3.0–12.0)
Monocytes Absolute: 0.4 10*3/uL (ref 0.1–1.0)
NEUTROS ABS: 2.7 10*3/uL (ref 1.4–7.7)
NEUTROS PCT: 42.1 % — AB (ref 43.0–77.0)
PLATELETS: 259 10*3/uL (ref 150.0–400.0)
RBC: 4.98 Mil/uL (ref 3.87–5.11)
RDW: 13.7 % (ref 11.5–15.5)
WBC: 6.3 10*3/uL (ref 4.0–10.5)

## 2015-09-18 LAB — LIPID PANEL
CHOL/HDL RATIO: 4
Cholesterol: 175 mg/dL (ref 0–200)
HDL: 48.4 mg/dL (ref >39.00)
LDL CALC: 116 mg/dL — AB (ref 0–99)
NONHDL: 126.27
Triglycerides: 53 mg/dL (ref 0.0–149.0)
VLDL: 10.6 mg/dL (ref 0.0–40.0)

## 2015-09-18 LAB — BASIC METABOLIC PANEL
BUN: 18 mg/dL (ref 6–23)
CALCIUM: 9.7 mg/dL (ref 8.4–10.5)
CO2: 26 meq/L (ref 19–32)
CREATININE: 1.01 mg/dL (ref 0.40–1.20)
Chloride: 107 mEq/L (ref 96–112)
GFR: 73.24 mL/min (ref >60.00)
Glucose, Bld: 109 mg/dL — ABNORMAL HIGH (ref 70–99)
Potassium: 4.3 mEq/L (ref 3.5–5.1)
SODIUM: 141 meq/L (ref 135–145)

## 2015-09-18 LAB — HEPATIC FUNCTION PANEL
ALK PHOS: 66 U/L (ref 39–117)
ALT: 28 U/L (ref 0–35)
AST: 27 U/L (ref 0–37)
Albumin: 4.2 g/dL (ref 3.5–5.2)
BILIRUBIN DIRECT: 0.1 mg/dL (ref 0.0–0.3)
Total Bilirubin: 0.3 mg/dL (ref 0.2–1.2)
Total Protein: 7.1 g/dL (ref 6.0–8.3)

## 2015-09-18 LAB — TSH: TSH: 2.04 u[IU]/mL (ref 0.35–4.50)

## 2015-09-21 NOTE — Progress Notes (Signed)
Chief Complaint  Patient presents with  . Annual Exam    HPI: Patient  Hannah [name]  55 y.o. comes in today for Preventive Health Care visit  Some    lifestyle intervention . Cutting out sugar drinks  Some exercise walking  Smoothies   Eyes  Stable  40-50 hours per week  Now on evista for a month  For dec bone density   Some hot flushes will be deiscussing with GYNE    Health Maintenance  Topic Date Due  . Hepatitis C Screening  December 06, 1960  . HIV Screening  11/26/1975  . PAP SMEAR  04/28/2014  . INFLUENZA VACCINE  01/21/2016  . MAMMOGRAM  06/30/2017  . COLONOSCOPY  02/23/2020  . TETANUS/TDAP  08/10/2024   Health Maintenance Review LIFESTYLE:  Exercise:   Walking    House cleaning  Tobacco/ETS: no Alcohol: ocass  Sugar beverages:  stevia  Sleep: 6 hours  Drug use: no    ROS:  GEN/ HEENT: No fever, significant weight changes sweats headaches vision problems hearing changes, CV/ PULM; No chest pain shortness of breath cough, syncope,edema  change in exercise tolerance. GI /GU: No adominal pain, vomiting, change in bowel habits. No blood in the stool. No significant GU symptoms. SKIN/HEME: ,no acute skin rashes suspicious lesions or bleeding. No lymphadenopathy, nodules, masses.  NEURO/ PSYCH:  No neurologic signs such as weakness numbness. No depression anxiety. IMM/ Allergy: No unusual infections.  Allergy .   REST of 12 system review negative except as per HPI   Past Medical History  Diagnosis Date  . Chest pain, atypical   . Rhinitis   . Nevus, atypical   . Chronic constipation   . Osteochondroma   . Asthma   . Uterine cancer ([place])   . H/O: hysterectomy 2011    presented with irregular bleeding  . ICP (infantile cerebral palsy) (H[facility])     elevated  . OSTEOCHONDROMA 10/10/2008    Qualifier: Diagnosis of  By: Ronnald Ramp CMA, [place]    . Iron deficiency anemia   . NEOPLASM, MALIGNANT, UTERUS, HX OF 01/06/2010    Qualifier: Diagnosis of  By: Regis Bill  MD, Standley Brooking   . MENOPAUSE, SURGICAL 01/06/2010    Qualifier: Diagnosis of  By: Regis Bill MD, Standley Brooking   . Glaucoma   . CHEST PAIN-PRECORDIAL 10/26/2008    Qualifier: Diagnosis of  By: Burt Knack, MD, Clayburn Pert     Past Surgical History  Procedure Laterality Date  . Cesarean section    . Umbilical hernia repair    . Foot surgery    . Total vaginal hysterectomy  2011  . Tubal ligation    . Myomectomy      Family History  Problem Relation Age of Onset  . Hypertension Mother     deceased  . Alcohol abuse Mother   . Hypertension Sister   . Diabetes Father     died from complications of dm, amputation and sepsis in his 75's   . Colon polyps    . Breast cancer Maternal Aunt     Social History   Social History  . Marital Status: Married    Spouse Name: N/A  . Number of Children: N/A  . Years of Education: N/A   Social History Main Topics  . Smoking status: Never Smoker   . Smokeless tobacco: None  . Alcohol Use: 0.0 oz/week    0 Standard drinks or equivalent per week     Comment: socially  . Drug Use:  No  . Sexual Activity: Not Asked   Other Topics Concern  . 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    She does not smoke cigarettes or use recreational drugs.     She drinks alcohol on rare occasions with 1 glass of wine. Approx. Once a month.  She does not do regular exercise.   recently    HH of 3 -4hysband and 48 yo son  Has exchange student from [place] now    Pet poodle   Husband is a smoker   [name] is married with 3 children, ages  One fr college another on her own       hh of 5     Outpatient Prescriptions Prior to Visit  Medication Sig Dispense Refill  . Biotin 5 MG TABS Take by mouth.      . fish oil-omega-3 fatty acids 1000 MG capsule Take 2 g by mouth daily.      . MULTIPLE VITAMIN PO Take by  mouth.      . Probiotic Product (PROBIOTIC FORMULA PO) Take by mouth.      . Tafluprost (ZIOPTAN) 0.0015 % SOLN Apply 1 drop to eye daily.    Marland Kitchen MOVIPREP 100 G SOLR Take 1 kit (100 g total) by mouth once. (Patient not taking: Reported on 08/10/2014) 1 kit 0  . VENTOLIN HFA 108 (90 BASE) MCG/ACT inhaler USE 2 PUFFS EVERY 4 TO 6 HOURS AS NEEDED FOR SHORTNESS OF BREATH (Patient not taking: Reported on 09/23/2015) 18 Inhaler 0  . HYDROcodone-acetaminophen (NORCO/VICODIN) 5-325 MG per tablet 1 to 2 tabs every 4 to 6 hours as needed for pain. (Patient not taking: Reported on 07/15/2015) 20 tablet 0  . meclizine (ANTIVERT) 25 MG tablet Take 1 tablet (25 mg total) by mouth every 4 (four) hours as needed for dizziness. 60 tablet 1  . meloxicam (MOBIC) 15 MG tablet Take 1 tablet (15 mg total) by mouth daily. (Patient not taking: Reported on 07/15/2015) 15 tablet 0   No facility-administered medications prior to visit.     EXAM:  BP 118/80 mmHg  Temp(Src) 98.2 F (36.8 C) (Oral)  Ht _0  (1.702 m)  Wt 218 lb 8 oz (99.111 kg)  BMI 34.21 kg/m2  Body mass index is 34.21 kg/(m^2).  Physical Exam: Vital signs reviewed TFT:DDUK is a well-developed well-nourished alert cooperative    who appearsr stated age in no acute distress.  HEENT: normocephalic atraumatic , Eyes: PERRL EOM's full, conjunctiva clear, Nares: paten,t no deformity discharge or tenderness., Ears: no deformity EAC's clear TMs with normal landmarks. Mouth: clear OP, no lesions, edema.  Moist mucous membranes. Dentition in adequate repair. NECK: supple without masses, thyromegaly or bruits. CHEST/PULM:  Clear to auscultation and percussion breath sounds equal no wheeze , rales or rhonchi. No chest wall deformities or tenderness.Breast: normal by inspection . No dimpling, discharge, masses, tenderness or discharge . CV: PMI is nondisplaced, S1 S2 no gallops, murmurs, rubs. Peripheral pulses are full without delay.No JVD .  ABDOMEN: Bowel sounds  normal nontender  No guard or rebound, no hepato splenomegal no CVA tenderness.   Extremtities:  No clubbing cyanosis or edema, no acute joint swelling or redness no focal atrophy NEURO:  Oriented x3, cranial nerves 3-12 appear to be intact, no  obvious focal weakness,gait within normal limits no abnormal reflexes or asymmetrical SKIN: No acute rashes normal turgor, color, no bruising or petechiae. PSYCH: Oriented, good eye contact, no obvious depression anxiety, cognition and judgment appear normal. LN: no cervical axillary inguinal adenopathy  Lab Results  Component Value Date   WBC 6.3 09/18/2015   HGB 13.8 09/18/2015   HCT 41.4 09/18/2015   PLT 259.0 09/18/2015   GLUCOSE 109* 09/18/2015   CHOL 175 09/18/2015   TRIG 53.0 09/18/2015   HDL 48.40 09/18/2015   LDLDIRECT 137.9 10/10/2008   LDLCALC 116* 09/18/2015   ALT 28 09/18/2015   AST 27 09/18/2015   NA 141 09/18/2015   K 4.3 09/18/2015   CL 107 09/18/2015   CREATININE 1.01 09/18/2015   BUN 18 09/18/2015   CO2 26 09/18/2015   TSH 2.04 09/18/2015   HGBA1C 5.9 01/14/2010    ASSESSMENT AND PLAN:  Discussed the following assessment and plan:  Visit for preventive health examination  Fasting hyperglycemia - borderline  reveiwed lsi will let us know ifwishes to have nutrition consult  Allergic rhinitis, seasonal - prob cause of seasonal cough albuterol as needed 1 ihnaler per year disc opt add singulair etc not that bad will let us know  revewieed diet  And exercise  Etc  Lower risk hep c and hiv but can add on at next labs.  Patient Care Team: Burnis Medin, MD as PCP - General [place] Dragon, MD (Gastroenterology) Servando Salina, MD (Obstetrics and Gynecology) Sherren Mocha, MD (Cardiology) Patient Instructions  Continue lifestyle intervention healthy eating and exercise . Avoid simple carbs and processed foods   As we discussed .  Mediterranean diet is the healthyiest type.   If cough be comes more of a problem  lett [place] know but this sounds like an alelrgic  Cough  Your exam is good today .   Health Maintenance, Female Adopting a healthy lifestyle and getting preventive care can go a long way to promote health and wellness. Talk with your health care provider about what schedule of regular examinations is right for you. This is a good chance for you to check in with your provider about disease prevention and staying healthy. In between checkups, there are plenty of things you can do on your own. Experts have done a lot of research about which lifestyle changes and preventive measures are most likely to keep you healthy. Ask your health care provider for more information. WEIGHT AND DIET  Eat a healthy diet  Be sure to include plenty of vegetables, fruits, low-fat dairy products, and lean protein.  Do not eat a lot of foods high in solid fats, added sugars, or salt.  Get regular exercise. This is one of the most important things you can do for your health.  Most adults should exercise for at least 150 minutes each week. The exercise should increase your heart rate and make you sweat (moderate-intensity exercise).  Most adults should also do strengthening exercises at least twice a week. This is in addition to the moderate-intensity exercise.  Maintain a healthy weight  Body mass index (BMI) is a measurement that can be used to identify possible weight problems. It estimates body fat based on height and weight. Your health care provider can help determine your BMI and help you achieve or maintain a healthy weight.  For females 13 years of age and older:   A BMI below 18.5 is considered underweight.  A BMI of 18.5 to 24.9  is normal.  A BMI of 25 to 29.9 is considered overweight.  A BMI of 30 and above is considered obese.  Watch levels of cholesterol and blood lipids  You should start having your blood tested for lipids and cholesterol at 55 years of age, then have this test every 5  years.  You may need to have your cholesterol levels checked more often if:  Your lipid or cholesterol levels are high.  You are older than 55 years of age.  You are at high risk for heart disease.  CANCER SCREENING   Lung Cancer  Lung cancer screening is recommended for adults 46-57 years old who are at high risk for lung cancer because of a history of smoking.  A yearly low-dose CT scan of the lungs is recommended for people who:  Currently smoke.  Have quit within the past 15 years.  Have at least a 30-pack-year history of smoking. A pack year is smoking an average of one pack of cigarettes a day for 1 year.  Yearly screening should continue until it has been 15 years since you quit.  Yearly screening should stop if you develop a health problem that would prevent you from having lung cancer treatment.  Breast Cancer  Practice breast self-awareness. This means understanding how your breasts normally appear and feel.  It also means doing regular breast self-exams. Let your health care provider know about any changes, no matter how small.  If you are in your 20s or 30s, you should have a clinical breast exam (CBE) by a health care provider every 1-3 years as part of a regular health exam.  If you are 66 or older, have a CBE every year. Also consider having a breast X-ray (mammogram) every year.  If you have a family history of breast cancer, talk to your health care provider about genetic screening.  If you are at high risk for breast cancer, talk to your health care provider about having an MRI and a mammogram every year.  Breast cancer gene (BRCA) assessment is recommended for women who have family members with BRCA-related cancers. BRCA-related cancers include:  Breast.  Ovarian.  Tubal.  Peritoneal cancers.  Results of the assessment will determine the need for genetic counseling and BRCA1 and BRCA2 testing. Cervical Cancer Your health care provider may  recommend that you be screened regularly for cancer of the pelvic organs (ovaries, uterus, and vagina). This screening involves a pelvic examination, including checking for microscopic changes to the surface of your cervix (Pap test). You may be encouraged to have this screening done every 3 years, beginning at age 14.  For women ages 78-65, health care providers may recommend pelvic exams and Pap testing every 3 years, or they may recommend the Pap and pelvic exam, combined with testing for human papilloma virus (HPV), every 5 years. Some types of HPV increase your risk of cervical cancer. Testing for HPV may also be done on women of any age with unclear Pap test results.  Other health care providers may not recommend any screening for nonpregnant women who are considered low risk for pelvic cancer and who do not have symptoms. Ask your health care provider if a screening pelvic exam is right for you.  If you have had past treatment for cervical cancer or a condition that could lead to cancer, you need Pap tests and screening for cancer for at least 20 years after your treatment. If Pap tests have been discontinued, your risk factors (  such as having a new sexual partner) need to be reassessed to determine if screening should resume. Some women have medical problems that increase the chance of getting cervical cancer. In these cases, your health care provider may recommend more frequent screening and Pap tests. Colorectal Cancer  This type of cancer can be detected and often prevented.  Routine colorectal cancer screening usually begins at 55 years of age and continues through 55 years of age.  Your health care provider may recommend screening at an earlier age if you have risk factors for colon cancer.  Your health care provider may also recommend using home test kits to check for hidden blood in the stool.  A small camera at the end of a tube can be used to examine your colon directly  (sigmoidoscopy or colonoscopy). This is done to check for the earliest forms of colorectal cancer.  Routine screening usually begins at age 48.  Direct examination of the colon should be repeated every 5-10 years through 55 years of age. However, you may need to be screened more often if early forms of precancerous polyps or small growths are found. Skin Cancer  Check your skin from head to toe regularly.  Tell your health care provider about any new moles or changes in moles, especially if there is a change in a mole's shape or color.  Also tell your health care provider if you have a mole that is larger than the size of a pencil eraser.  Always use sunscreen. Apply sunscreen liberally and repeatedly throughout the day.  Protect yourself by wearing long sleeves, pants, a wide-brimmed hat, and sunglasses whenever you are outside. HEART DISEASE, DIABETES, AND HIGH BLOOD PRESSURE   High blood pressure causes heart disease and increases the risk of stroke. High blood pressure is more likely to develop in:  People who have blood pressure in the high end of the normal range (130-139/85-89 mm Hg).  People who are overweight or obese.  People who are African American.  If you are 82-5 years of age, have your blood pressure checked every 3-5 years. If you are 11 years of age or older, have your blood pressure checked every year. You should have your blood pressure measured twice--once when you are at a hospital or clinic, and once when you are not at a hospital or clinic. Record the average of the two measurements. To check your blood pressure when you are not at a hospital or clinic, you can use:  An automated blood pressure machine at a pharmacy.  A home blood pressure monitor.  If you are between 64 years and 65 years old, ask your health care provider if you should take aspirin to prevent strokes.  Have regular diabetes screenings. This involves taking a blood sample to check your  fasting blood sugar level.  If you are at a normal weight and have a low risk for diabetes, have this test once every three years after 55 years of age.  If you are overweight and have a high risk for diabetes, consider being tested at a younger age or more often. PREVENTING INFECTION  Hepatitis B  If you have a higher risk for hepatitis B, you should be screened for this virus. You are considered at high risk for hepatitis B if:  You were born in a country where hepatitis B is common. Ask your health care provider which countries are considered high risk.  Your parents were born in a high-risk country, and you  have not been immunized against hepatitis B (hepatitis B vaccine).  You have HIV or AIDS.  You use needles to inject street drugs.  You live with someone who has hepatitis B.  You have had sex with someone who has hepatitis B.  You get hemodialysis treatment.  You take certain medicines for conditions, including cancer, organ transplantation, and autoimmune conditions. Hepatitis C  Blood testing is recommended for:  Everyone born from [birth year] through 1965.  Anyone with known risk factors for hepatitis C. Sexually transmitted infections (STIs)  You should be screened for sexually transmitted infections (STIs) including gonorrhea and chlamydia if:  You are sexually active and are younger than 54 years of age.  You are older than 55 years of age and your health care provider tells you that you are at risk for this type of infection.  Your sexual activity has changed since you were last screened and you are at an increased risk for chlamydia or gonorrhea. Ask your health care provider if you are at risk.  If you do not have HIV, but are at risk, it may be recommended that you take a prescription medicine daily to prevent HIV infection. This is called pre-exposure prophylaxis (PrEP). You are considered at risk if:  You are sexually active and do not regularly use condoms or  know the HIV status of your partner(s).  You take drugs by injection.  You are sexually active with a partner who has HIV. Talk with your health care provider about whether you are at high risk of being infected with HIV. If you choose to begin PrEP, you should first be tested for HIV. You should then be tested every 3 months for as long as you are taking PrEP.  PREGNANCY   If you are premenopausal and you may become pregnant, ask your health care provider about preconception counseling.  If you may become pregnant, take 400 to 800 micrograms (mcg) of folic acid every day.  If you want to prevent pregnancy, talk to your health care provider about birth control (contraception). OSTEOPOROSIS AND MENOPAUSE   Osteoporosis is a disease in which the bones lose minerals and strength with aging. This can result in serious bone fractures. Your risk for osteoporosis can be identified using a bone density scan.  If you are 94 years of age or older, or if you are at risk for osteoporosis and fractures, ask your health care provider if you should be screened.  Ask your health care provider whether you should take a calcium or vitamin D supplement to lower your risk for osteoporosis.  Menopause may have certain physical symptoms and risks.  Hormone replacement therapy may reduce some of these symptoms and risks. Talk to your health care provider about whether hormone replacement therapy is right for you.  HOME CARE INSTRUCTIONS   Schedule regular health, dental, and eye exams.  Stay current with your immunizations.   Do not use any tobacco products including cigarettes, chewing tobacco, or electronic cigarettes.  If you are pregnant, do not drink alcohol.  If you are breastfeeding, limit how much and how often you drink alcohol.  Limit alcohol intake to no more than 1 drink per day for nonpregnant women. One drink equals 12 ounces of beer, 5 ounces of wine, or 1 ounces of hard liquor.  Do  not use street drugs.  Do not share needles.  Ask your health care provider for help if you need support or information about quitting drugs.  Tell  your health care provider if you often feel depressed.  Tell your health care provider if you have ever been abused or do not feel safe at home.   This information is not intended to replace advice given to you by your health care provider. Make sure you discuss any questions you have with your health care provider.   Document Released: 12/22/2010 Document Revised: 06/29/2014 Document Reviewed: 05/10/2013 Elsevier Interactive Patient Education 2016 [place] K. Fabrice Dyal M.D.

## 2015-09-21 NOTE — Patient Instructions (Addendum)
Continue lifestyle intervention healthy eating and exercise . Avoid simple carbs and processed foods   As we discussed .  Mediterranean diet is the healthyiest type.   If cough be comes more of a problem lett [place] know but this sounds like an alelrgic  Cough  Your exam is good today .   Health Maintenance, Female Adopting a healthy lifestyle and getting preventive care can go a long way to promote health and wellness. Talk with your health care provider about what schedule of regular examinations is right for you. This is a good chance for you to check in with your provider about disease prevention and staying healthy. In between checkups, there are plenty of things you can do on your own. Experts have done a lot of research about which lifestyle changes and preventive measures are most likely to keep you healthy. Ask your health care provider for more information. WEIGHT AND DIET  Eat a healthy diet  Be sure to include plenty of vegetables, fruits, low-fat dairy products, and lean protein.  Do not eat a lot of foods high in solid fats, added sugars, or salt.  Get regular exercise. This is one of the most important things you can do for your health.  Most adults should exercise for at least 150 minutes each week. The exercise should increase your heart rate and make you sweat (moderate-intensity exercise).  Most adults should also do strengthening exercises at least twice a week. This is in addition to the moderate-intensity exercise.  Maintain a healthy weight  Body mass index (BMI) is a measurement that can be used to identify possible weight problems. It estimates body fat based on height and weight. Your health care provider can help determine your BMI and help you achieve or maintain a healthy weight.  For females 31 years of age and older:   A BMI below 18.5 is considered underweight.  A BMI of 18.5 to 24.9 is normal.  A BMI of 25 to 29.9 is considered overweight.  A BMI of  30 and above is considered obese.  Watch levels of cholesterol and blood lipids  You should start having your blood tested for lipids and cholesterol at 55 years of age, then have this test every 5 years.  You may need to have your cholesterol levels checked more often if:  Your lipid or cholesterol levels are high.  You are older than 55 years of age.  You are at high risk for heart disease.  CANCER SCREENING   Lung Cancer  Lung cancer screening is recommended for adults 39-75 years old who are at high risk for lung cancer because of a history of smoking.  A yearly low-dose CT scan of the lungs is recommended for people who:  Currently smoke.  Have quit within the past 15 years.  Have at least a 30-pack-year history of smoking. A pack year is smoking an average of one pack of cigarettes a day for 1 year.  Yearly screening should continue until it has been 15 years since you quit.  Yearly screening should stop if you develop a health problem that would prevent you from having lung cancer treatment.  Breast Cancer  Practice breast self-awareness. This means understanding how your breasts normally appear and feel.  It also means doing regular breast self-exams. Let your health care provider know about any changes, no matter how small.  If you are in your 20s or 30s, you should have a clinical breast exam (CBE) by  a health care provider every 1-3 years as part of a regular health exam.  If you are 3 or older, have a CBE every year. Also consider having a breast X-ray (mammogram) every year.  If you have a family history of breast cancer, talk to your health care provider about genetic screening.  If you are at high risk for breast cancer, talk to your health care provider about having an MRI and a mammogram every year.  Breast cancer gene (BRCA) assessment is recommended for women who have family members with BRCA-related cancers. BRCA-related cancers  include:  Breast.  Ovarian.  Tubal.  Peritoneal cancers.  Results of the assessment will determine the need for genetic counseling and BRCA1 and BRCA2 testing. Cervical Cancer Your health care provider may recommend that you be screened regularly for cancer of the pelvic organs (ovaries, uterus, and vagina). This screening involves a pelvic examination, including checking for microscopic changes to the surface of your cervix (Pap test). You may be encouraged to have this screening done every 3 years, beginning at age 87.  For women ages 10-65, health care providers may recommend pelvic exams and Pap testing every 3 years, or they may recommend the Pap and pelvic exam, combined with testing for human papilloma virus (HPV), every 5 years. Some types of HPV increase your risk of cervical cancer. Testing for HPV may also be done on women of any age with unclear Pap test results.  Other health care providers may not recommend any screening for nonpregnant women who are considered low risk for pelvic cancer and who do not have symptoms. Ask your health care provider if a screening pelvic exam is right for you.  If you have had past treatment for cervical cancer or a condition that could lead to cancer, you need Pap tests and screening for cancer for at least 20 years after your treatment. If Pap tests have been discontinued, your risk factors (such as having a new sexual partner) need to be reassessed to determine if screening should resume. Some women have medical problems that increase the chance of getting cervical cancer. In these cases, your health care provider may recommend more frequent screening and Pap tests. Colorectal Cancer  This type of cancer can be detected and often prevented.  Routine colorectal cancer screening usually begins at 55 years of age and continues through 55 years of age.  Your health care provider may recommend screening at an earlier age if you have risk factors for  colon cancer.  Your health care provider may also recommend using home test kits to check for hidden blood in the stool.  A small camera at the end of a tube can be used to examine your colon directly (sigmoidoscopy or colonoscopy). This is done to check for the earliest forms of colorectal cancer.  Routine screening usually begins at age 61.  Direct examination of the colon should be repeated every 5-10 years through 55 years of age. However, you may need to be screened more often if early forms of precancerous polyps or small growths are found. Skin Cancer  Check your skin from head to toe regularly.  Tell your health care provider about any new moles or changes in moles, especially if there is a change in a mole's shape or color.  Also tell your health care provider if you have a mole that is larger than the size of a pencil eraser.  Always use sunscreen. Apply sunscreen liberally and repeatedly throughout the day.  Protect yourself by wearing long sleeves, pants, a wide-brimmed hat, and sunglasses whenever you are outside. HEART DISEASE, DIABETES, AND HIGH BLOOD PRESSURE   High blood pressure causes heart disease and increases the risk of stroke. High blood pressure is more likely to develop in:  People who have blood pressure in the high end of the normal range (130-139/85-89 mm Hg).  People who are overweight or obese.  People who are African American.  If you are 18-39 years of age, have your blood pressure checked every 3-5 years. If you are 40 years of age or older, have your blood pressure checked every year. You should have your blood pressure measured twice--once when you are at a hospital or clinic, and once when you are not at a hospital or clinic. Record the average of the two measurements. To check your blood pressure when you are not at a hospital or clinic, you can use:  An automated blood pressure machine at a pharmacy.  A home blood pressure monitor.  If you  are between 55 years and 79 years old, ask your health care provider if you should take aspirin to prevent strokes.  Have regular diabetes screenings. This involves taking a blood sample to check your fasting blood sugar level.  If you are at a normal weight and have a low risk for diabetes, have this test once every three years after 55 years of age.  If you are overweight and have a high risk for diabetes, consider being tested at a younger age or more often. PREVENTING INFECTION  Hepatitis B  If you have a higher risk for hepatitis B, you should be screened for this virus. You are considered at high risk for hepatitis B if:  You were born in a country where hepatitis B is common. Ask your health care provider which countries are considered high risk.  Your parents were born in a high-risk country, and you have not been immunized against hepatitis B (hepatitis B vaccine).  You have HIV or AIDS.  You use needles to inject street drugs.  You live with someone who has hepatitis B.  You have had sex with someone who has hepatitis B.  You get hemodialysis treatment.  You take certain medicines for conditions, including cancer, organ transplantation, and autoimmune conditions. Hepatitis C  Blood testing is recommended for:  Everyone born from 1945 through 1965.  Anyone with known risk factors for hepatitis C. Sexually transmitted infections (STIs)  You should be screened for sexually transmitted infections (STIs) including gonorrhea and chlamydia if:  You are sexually active and are younger than 55 years of age.  You are older than 55 years of age and your health care provider tells you that you are at risk for this type of infection.  Your sexual activity has changed since you were last screened and you are at an increased risk for chlamydia or gonorrhea. Ask your health care provider if you are at risk.  If you do not have HIV, but are at risk, it may be recommended that you  take a prescription medicine daily to prevent HIV infection. This is called pre-exposure prophylaxis (PrEP). You are considered at risk if:  You are sexually active and do not regularly use condoms or know the HIV status of your partner(s).  You take drugs by injection.  You are sexually active with a partner who has HIV. Talk with your health care provider about whether you are at high risk of being infected   with HIV. If you choose to begin PrEP, you should first be tested for HIV. You should then be tested every 3 months for as long as you are taking PrEP.  PREGNANCY   If you are premenopausal and you may become pregnant, ask your health care provider about preconception counseling.  If you may become pregnant, take 400 to 800 micrograms (mcg) of folic acid every day.  If you want to prevent pregnancy, talk to your health care provider about birth control (contraception). OSTEOPOROSIS AND MENOPAUSE   Osteoporosis is a disease in which the bones lose minerals and strength with aging. This can result in serious bone fractures. Your risk for osteoporosis can be identified using a bone density scan.  If you are 28 years of age or older, or if you are at risk for osteoporosis and fractures, ask your health care provider if you should be screened.  Ask your health care provider whether you should take a calcium or vitamin D supplement to lower your risk for osteoporosis.  Menopause may have certain physical symptoms and risks.  Hormone replacement therapy may reduce some of these symptoms and risks. Talk to your health care provider about whether hormone replacement therapy is right for you.  HOME CARE INSTRUCTIONS   Schedule regular health, dental, and eye exams.  Stay current with your immunizations.   Do not use any tobacco products including cigarettes, chewing tobacco, or electronic cigarettes.  If you are pregnant, do not drink alcohol.  If you are breastfeeding, limit how  much and how often you drink alcohol.  Limit alcohol intake to no more than 1 drink per day for nonpregnant women. One drink equals 12 ounces of beer, 5 ounces of wine, or 1 ounces of hard liquor.  Do not use street drugs.  Do not share needles.  Ask your health care provider for help if you need support or information about quitting drugs.  Tell your health care provider if you often feel depressed.  Tell your health care provider if you have ever been abused or do not feel safe at home.   This information is not intended to replace advice given to you by your health care provider. Make sure you discuss any questions you have with your health care provider.   Document Released: 12/22/2010 Document Revised: 06/29/2014 Document Reviewed: 05/10/2013 Elsevier Interactive Patient Education [employer].

## 2015-09-23 ENCOUNTER — Ambulatory Visit (INDEPENDENT_AMBULATORY_CARE_PROVIDER_SITE_OTHER): Payer: 59 | Admitting: Internal Medicine

## 2015-09-23 ENCOUNTER — Encounter: Payer: Self-pay | Admitting: Internal Medicine

## 2015-09-23 VITALS — BP 118/80 | Temp 98.2°F | Ht 67.0 in | Wt 218.5 lb

## 2015-09-23 DIAGNOSIS — Z0000 Encounter for general adult medical examination without abnormal findings: Secondary | ICD-10-CM | POA: Diagnosis not present

## 2015-09-23 DIAGNOSIS — J302 Other seasonal allergic rhinitis: Secondary | ICD-10-CM | POA: Diagnosis not present

## 2015-09-23 DIAGNOSIS — R7301 Impaired fasting glucose: Secondary | ICD-10-CM

## 2016-02-04 ENCOUNTER — Ambulatory Visit (INDEPENDENT_AMBULATORY_CARE_PROVIDER_SITE_OTHER): Payer: 59 | Admitting: Internal Medicine

## 2016-02-04 ENCOUNTER — Encounter: Payer: Self-pay | Admitting: Internal Medicine

## 2016-02-04 VITALS — BP 120/80 | HR 89 | Temp 98.2°F | Wt 221.0 lb

## 2016-02-04 DIAGNOSIS — J45901 Unspecified asthma with (acute) exacerbation: Secondary | ICD-10-CM | POA: Diagnosis not present

## 2016-02-04 DIAGNOSIS — J018 Other acute sinusitis: Secondary | ICD-10-CM | POA: Diagnosis not present

## 2016-02-04 MED ORDER — METHYLPREDNISOLONE ACETATE 80 MG/ML IJ SUSP
120.0000 mg | Freq: Once | INTRAMUSCULAR | Status: AC
  Administered 2016-02-04: 120 mg via INTRAMUSCULAR

## 2016-02-04 MED ORDER — DOXYCYCLINE HYCLATE 100 MG PO TABS: 100.0000 mg | ORAL_TABLET | Freq: Two times a day (BID) | ORAL | 0 refills | Status: DC

## 2016-02-04 MED ORDER — IPRATROPIUM-ALBUTEROL 0.5-2.5 (3) MG/3ML IN SOLN
3.0000 mL | Freq: Once | RESPIRATORY_TRACT | Status: AC
  Administered 2016-02-04: 3 mL via RESPIRATORY_TRACT

## 2016-02-04 NOTE — Progress Notes (Signed)
Chief Complaint  Patient presents with  . Cough    X5 days.  Is taking Sudafed and Delsym  . Nasal Congestion  . Headache  . Shortness of Breath  . Facial Pain    HPI: Hannah [name] 55 y.o. comes for sda  Onset like   Tightness in chest  and how head upper pressure .   Onset 5 days ago .   felt like asthma .Marland Kitchen Face pain  Teeth hurts and chewing .    Just started .  No itching some sneezing   Cant stop coughing  Albuterol seems to help but  "Not strong enough" ROS: See pertinent positives and negatives per HPI. No cp some doe sob feels ashtma flaring   abuterio l; helps a little  Bit  nasa ld drain is clear   Past Medical History:  Diagnosis Date  . Asthma   . Chest pain, atypical   . CHEST PAIN-PRECORDIAL 10/26/2008   Qualifier: Diagnosis of  By: Burt Knack, MD, Clayburn Pert   . Chronic constipation   . Glaucoma   . H/O: hysterectomy 2011   presented with irregular bleeding  . ICP (infantile cerebral palsy) (HCC)    elevated  . Iron deficiency anemia   . MENOPAUSE, SURGICAL 01/06/2010   Qualifier: Diagnosis of  By: Regis Bill MD, Standley Brooking   . NEOPLASM, MALIGNANT, UTERUS, HX OF 01/06/2010   Qualifier: Diagnosis of  By: Regis Bill MD, Standley Brooking   . Nevus, atypical   . Osteochondroma   . OSTEOCHONDROMA 10/10/2008   Qualifier: Diagnosis of  By: Ronnald Ramp CMA, Chemira    . Rhinitis   . Uterine cancer ([place])     Family History  Problem Relation Age of Onset  . Hypertension Mother     deceased  . Alcohol abuse Mother   . Hypertension Sister   . Diabetes Father     died from complications of dm, amputation and sepsis in his 48's   . Colon polyps    . Breast cancer Maternal Aunt     Social History   Social History  . Marital status: Married    Spouse name: N/A  . Number of children: N/A  . Years of education: N/A   Social History Main Topics  . Smoking status: Never Smoker  . Smokeless tobacco: Never Used  . Alcohol use 0.0 oz/week     Comment: socially  . Drug use: No  .  Sexual activity: Not Asked   Other Topics Concern  . 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    She does not smoke cigarettes or use recreational drugs.     She drinks alcohol on rare occasions with 1 glass of wine. Approx. Once a month.  She does not do regular exercise.   recently    HH of 3 -4hysband and 48 yo son  Has exchange student from [place] now    Pet poodle   Husband is a smoker   [name] is married with 3 children, ages  One fr college another on her own       hh of 5     Outpatient Medications Prior to Visit  Medication Sig Dispense Refill  . Biotin 5 MG TABS Take by mouth.      . Cholecalciferol (  VITAMIN D PO) Take by mouth.    . fish oil-omega-3 fatty acids 1000 MG capsule Take 2 g by mouth daily.      . MULTIPLE VITAMIN PO Take by mouth.      . Probiotic Product (PROBIOTIC FORMULA PO) Take by mouth.      . raloxifene (EVISTA) 60 MG tablet Take 60 mg by mouth daily.    . Tafluprost (ZIOPTAN) 0.0015 % SOLN Apply 1 drop to eye daily.    . VENTOLIN HFA 108 (90 BASE) MCG/ACT inhaler USE 2 PUFFS EVERY 4 TO 6 HOURS AS NEEDED FOR SHORTNESS OF BREATH 18 Inhaler 0  . MOVIPREP 100 G SOLR Take 1 kit (100 g total) by mouth once. (Patient not taking: Reported on 08/10/2014) 1 kit 0   No facility-administered medications prior to visit.      EXAM:  BP 120/80 (BP Location: Right Arm, Patient Position: Sitting, Cuff Size: Large)   Pulse 89   Temp 98.2 F (36.8 C) (Oral)   Wt 221 lb (100.2 kg)   SpO2 97%   BMI 34.61 kg/m   Body mass index is 34.61 kg/m.  GENERAL: vitals reviewed and listed above, alert, oriented, appears well hydrated and in no acute distressver congested and cough  intermittent   HEENT: atraumatic, conjunctiva  clear, no obvious abnormalities on inspection of external nose and ears  Nares  congested face tedner bilateral maxillary area  tmx clear  OP : no lesion edema or exudate  NECK: no obvious masses on inspection palpation  LUNGS:  Dec bs no rales rhonchi  Tight cough   CV: HRRR, no clubbing cyanosis or  peripheral edema nl cap refill  MS: moves all extremities without noticeable focal  abnormality PSYCH: pleasant and cooperative, no obvious depression or anxiety  ASSESSMENT AND PLAN:  Discussed the following assessment and plan:  Asthmatic bronchitis with acute exacerbation - Plan: ipratropium-albuterol (DUONEB) 0.5-2.5 (3) MG/3ML nebulizer solution 3 mL  Other acute sinusitis   Expectant management. pred causes insomnia  Want to try depomedrol instead  -Patient advised to return or notify health care team  if symptoms worsen ,persist or new concerns arise.  Patient Instructions  Oxygen level is good  But asthma flaring causing sx with sinus inflammation Sometimes this is a vrial infection sometimes bacterial infection   Plan  Injection  Of steroid ro this instead of prednisone  For the asthma  And  Can consider adding  Antibiotic for sinuses if not improved .  Expect  Congestion to last a week or so but the breathing and sinus pain should get better int he next 2 days or less.  Continue the albuterol inhaler every 6 hours  As needed   Until better  Fu contact us if fever  Worsening    .       Sinusitis, Adult Sinusitis is redness, soreness, and inflammation of the paranasal sinuses. Paranasal sinuses are air pockets within the bones of your face. They are located beneath your eyes, in the middle of your forehead, and above your eyes. In healthy paranasal sinuses, mucus is able to drain out, and air is able to circulate through them by way of your nose. However, when your paranasal sinuses are inflamed, mucus and air can become trapped. This can allow bacteria and other germs to grow and cause infection. Sinusitis can develop quickly and last only a short time  (acute) or continue over a long period (chronic). Sinusitis that lasts for more than 12  weeks is considered chronic. CAUSES Causes of sinusitis include:  Allergies.  Structural abnormalities, such as displacement of the cartilage that separates your nostrils (deviated septum), which can decrease the air flow through your nose and sinuses and affect sinus drainage.  Functional abnormalities, such as when the small hairs (cilia) that line your sinuses and help remove mucus do not work properly or are not present. SIGNS AND SYMPTOMS Symptoms of acute and chronic sinusitis are the same. The primary symptoms are pain and pressure around the affected sinuses. Other symptoms include:  Upper toothache.  Earache.  Headache.  Bad breath.  Decreased sense of smell and taste.  A cough, which worsens when you are lying flat.  Fatigue.  Fever.  Thick drainage from your nose, which often is green and may contain pus (purulent).  Swelling and warmth over the affected sinuses. DIAGNOSIS Your health care provider will perform a physical exam. During your exam, your health care provider may perform any of the following to help determine if you have acute sinusitis or chronic sinusitis:  Look in your nose for signs of abnormal growths in your nostrils (nasal polyps).  Tap over the affected sinus to check for signs of infection.  View the inside of your sinuses using an imaging device that has a light attached (endoscope). If your health care provider suspects that you have chronic sinusitis, one or more of the following tests may be recommended:  Allergy tests.  Nasal culture. A sample of mucus is taken from your nose, sent to a lab, and screened for bacteria.  Nasal cytology. A sample of mucus is taken from your nose and examined by your health care provider to determine if your sinusitis is related to an allergy. TREATMENT Most cases of acute sinusitis are related to a viral infection  and will resolve on their own within 10 days. Sometimes, medicines are prescribed to help relieve symptoms of both acute and chronic sinusitis. These may include pain medicines, decongestants, nasal steroid sprays, or saline sprays. However, for sinusitis related to a bacterial infection, your health care provider will prescribe antibiotic medicines. These are medicines that will help kill the bacteria causing the infection. Rarely, sinusitis is caused by a fungal infection. In these cases, your health care provider will prescribe antifungal medicine. For some cases of chronic sinusitis, surgery is needed. Generally, these are cases in which sinusitis recurs more than 3 times per year, despite other treatments. HOME CARE INSTRUCTIONS  Drink plenty of water. Water helps thin the mucus so your sinuses can drain more easily.  Use a humidifier.  Inhale steam 3-4 times a day (for example, sit in the bathroom with the shower running).  Apply a warm, moist washcloth to your face 3-4 times a day, or as directed by your health care provider.  Use saline nasal sprays to help moisten and clean your sinuses.  Take medicines only as directed by your health care provider.  If you were prescribed either an antibiotic or antifungal medicine, finish it all even if you start to feel better. SEEK IMMEDIATE MEDICAL CARE IF:  You have increasing pain or severe headaches.  You have nausea, vomiting, or drowsiness.  You have swelling around your face.  You have vision problems.  You have a stiff neck.  You have difficulty breathing.   This information is not intended to replace advice given to you by your health care provider. Make sure you discuss any questions you have with your health care provider.  Document Released: 06/08/2005 Document Revised: 06/29/2014 Document Reviewed: 06/23/2011 Elsevier Interactive Patient Education 2016 [place] K. Kealey Kemmer M.D.

## 2016-02-04 NOTE — Addendum Note (Signed)
Addended by: Miles Costain T on: 02/04/2016 04:34 PM   Modules accepted: Orders

## 2016-02-04 NOTE — Progress Notes (Signed)
Pre visit review using our clinic review tool, if applicable. No additional management support is needed unless otherwise documented below in the visit note. 

## 2016-02-04 NOTE — Patient Instructions (Addendum)
Oxygen level is good  But asthma flaring causing sx with sinus inflammation Sometimes this is a vrial infection sometimes bacterial infection   Plan  Injection  Of steroid ro this instead of prednisone  For the asthma  And  Can consider adding  Antibiotic for sinuses if not improved .  Expect  Congestion to last a week or so but the breathing and sinus pain should get better int he next 2 days or less.  Continue the albuterol inhaler every 6 hours  As needed   Until better  Fu contact us if fever  Worsening    .       Sinusitis, Adult Sinusitis is redness, soreness, and inflammation of the paranasal sinuses. Paranasal sinuses are air pockets within the bones of your face. They are located beneath your eyes, in the middle of your forehead, and above your eyes. In healthy paranasal sinuses, mucus is able to drain out, and air is able to circulate through them by way of your nose. However, when your paranasal sinuses are inflamed, mucus and air can become trapped. This can allow bacteria and other germs to grow and cause infection. Sinusitis can develop quickly and last only a short time (acute) or continue over a long period (chronic). Sinusitis that lasts for more than 12 weeks is considered chronic. CAUSES Causes of sinusitis include:  Allergies.  Structural abnormalities, such as displacement of the cartilage that separates your nostrils (deviated septum), which can decrease the air flow through your nose and sinuses and affect sinus drainage.  Functional abnormalities, such as when the small hairs (cilia) that line your sinuses and help remove mucus do not work properly or are not present. SIGNS AND SYMPTOMS Symptoms of acute and chronic sinusitis are the same. The primary symptoms are pain and pressure around the affected sinuses. Other symptoms include:  Upper toothache.  Earache.  Headache.  Bad breath.  Decreased sense of smell and taste.  A cough, which worsens when you  are lying flat.  Fatigue.  Fever.  Thick drainage from your nose, which often is green and may contain pus (purulent).  Swelling and warmth over the affected sinuses. DIAGNOSIS Your health care provider will perform a physical exam. During your exam, your health care provider may perform any of the following to help determine if you have acute sinusitis or chronic sinusitis:  Look in your nose for signs of abnormal growths in your nostrils (nasal polyps).  Tap over the affected sinus to check for signs of infection.  View the inside of your sinuses using an imaging device that has a light attached (endoscope). If your health care provider suspects that you have chronic sinusitis, one or more of the following tests may be recommended:  Allergy tests.  Nasal culture. A sample of mucus is taken from your nose, sent to a lab, and screened for bacteria.  Nasal cytology. A sample of mucus is taken from your nose and examined by your health care provider to determine if your sinusitis is related to an allergy. TREATMENT Most cases of acute sinusitis are related to a viral infection and will resolve on their own within 10 days. Sometimes, medicines are prescribed to help relieve symptoms of both acute and chronic sinusitis. These may include pain medicines, decongestants, nasal steroid sprays, or saline sprays. However, for sinusitis related to a bacterial infection, your health care provider will prescribe antibiotic medicines. These are medicines that will help kill the bacteria causing the infection. Rarely, sinusitis  is caused by a fungal infection. In these cases, your health care provider will prescribe antifungal medicine. For some cases of chronic sinusitis, surgery is needed. Generally, these are cases in which sinusitis recurs more than 3 times per year, despite other treatments. HOME CARE INSTRUCTIONS  Drink plenty of water. Water helps thin the mucus so your sinuses can drain more  easily.  Use a humidifier.  Inhale steam 3-4 times a day (for example, sit in the bathroom with the shower running).  Apply a warm, moist washcloth to your face 3-4 times a day, or as directed by your health care provider.  Use saline nasal sprays to help moisten and clean your sinuses.  Take medicines only as directed by your health care provider.  If you were prescribed either an antibiotic or antifungal medicine, finish it all even if you start to feel better. SEEK IMMEDIATE MEDICAL CARE IF:  You have increasing pain or severe headaches.  You have nausea, vomiting, or drowsiness.  You have swelling around your face.  You have vision problems.  You have a stiff neck.  You have difficulty breathing.   This information is not intended to replace advice given to you by your health care provider. Make sure you discuss any questions you have with your health care provider.   Document Released: 06/08/2005 Document Revised: 06/29/2014 Document Reviewed: 06/23/2011 Elsevier Interactive Patient Education [employer].

## 2016-05-05 ENCOUNTER — Telehealth: Payer: Self-pay | Admitting: Family Medicine

## 2016-05-05 NOTE — Telephone Encounter (Signed)
Pt dropped off a form today to be filled out by Dr. Regis Bill.  It is a medical evaluation for social services.  Attached copy of CPX visit and placed on WP's desk.

## 2016-05-06 DIAGNOSIS — Z7689 Persons encountering health services in other specified circumstances: Secondary | ICD-10-CM

## 2016-05-07 NOTE — Telephone Encounter (Signed)
Form sent to the front.

## 2016-07-01 DIAGNOSIS — Z1231 Encounter for screening mammogram for malignant neoplasm of breast: Secondary | ICD-10-CM | POA: Diagnosis not present

## 2016-08-18 ENCOUNTER — Other Ambulatory Visit: Payer: Self-pay | Admitting: Internal Medicine

## 2016-09-17 ENCOUNTER — Other Ambulatory Visit: Payer: Self-pay

## 2016-09-18 ENCOUNTER — Other Ambulatory Visit: Payer: Self-pay

## 2016-09-24 NOTE — Progress Notes (Signed)
Chief Complaint  Patient presents with  . Annual Exam    HPI: Patient  Hannah Hannah [name]  56 y.o. comes in today for Preventive Health Care visit  Doing ok  Wants to get back to exercise and healthy weight  allergies  Doing ok .  But gets seasonal  Nose sx and concern about flaring up cough asthma sx   Uses albuterol ocassionally   In season.   Health Maintenance  Topic Date Due  . Hepatitis C Screening  09/25/2017 (Originally May 28, 1961)  . HIV Screening  09/25/2017 (Originally 11/26/1975)  . INFLUENZA VACCINE  01/20/2017  . MAMMOGRAM  06/30/2017  . PAP SMEAR  03/23/2019  . COLONOSCOPY  02/23/2020  . TETANUS/TDAP  08/10/2024   Health Maintenance Review LIFESTYLE:  Exercise:  Little to none.  Walk    Tobacco/ETS: no  Some ETS.   Alcohol:  Social  Sugar beverages: yes  2-3 per week .  Soda .    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 GI /GU: No adominal pain, vomiting, change in bowel habits. No blood in the stool. No significant GU symptoms. SKIN/HEME: ,no acute skin rashes suspicious lesions or bleeding. No lymphadenopathy, nodules, masses.  NEURO/ PSYCH:  No neurologic signs such as weakness numbness. No depression anxiety. IMM/ Allergy: No unusual infections.  Allergy .   REST of 12 system review negative except as per HPI   Past Medical History:  Diagnosis Date  . Asthma   . Chest pain, atypical   . CHEST PAIN-PRECORDIAL 10/26/2008   Qualifier: Diagnosis of  By: Burt Knack, MD, Clayburn Pert   . Chronic constipation   . Glaucoma   . H/O: hysterectomy 2011   presented with irregular bleeding  . ICP (infantile cerebral palsy) (HCC)    elevated  . Iron deficiency anemia   . MENOPAUSE, SURGICAL 01/06/2010   Qualifier: Diagnosis of  By: Regis Bill MD, Standley Brooking   . NEOPLASM, MALIGNANT, UTERUS, HX OF 01/06/2010   Qualifier: Diagnosis of  By: Regis Bill MD, Standley Brooking   . Nevus, atypical   . Osteochondroma   . OSTEOCHONDROMA 10/10/2008   Qualifier: Diagnosis of  By: Ronnald Ramp CMA, Chemira    . Rhinitis   . Uterine cancer [facility])     Past Surgical History:  Procedure Laterality Date  . CESAREAN SECTION    . FOOT SURGERY    . MYOMECTOMY    . TOTAL VAGINAL HYSTERECTOMY  2011  . TUBAL LIGATION    . UMBILICAL HERNIA REPAIR      Family History  Problem Relation Age of Onset  . Hypertension Mother     deceased  . Alcohol abuse Mother   . Hypertension Sister   . Diabetes Father     died from complications of dm, amputation and sepsis in his 8's   . Colon polyps    . Breast cancer Maternal Aunt     Social History   Social History  . Marital status: Married    Spouse name: N/A  . Number of children: N/A  . Years of education: N/A   Social History Main Topics  . Smoking status: Never Smoker  . Smokeless tobacco: Never Used  .  Alcohol use 0.0 oz/week     Comment: socially  . Drug use: No  . Sexual activity: Not Asked   Other Topics Concern  . 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    She does not smoke cigarettes or use recreational drugs.     She drinks alcohol on rare occasions with 1 glass of wine. Approx. Once a month.  She does not do regular exercise.   recently    HH of 3 -4hysband and 92 yo son  Has exchange student from [place] now    Pet poodle   Husband is a smoker   Hannah [name] is married with 3 children, ages  One fr college another on her own       hh of 5     Outpatient Medications Prior to Visit  Medication Sig Dispense Refill  . Biotin 5 MG TABS Take by mouth.      . Cholecalciferol (VITAMIN D PO) Take by mouth  daily.     . fish oil-omega-3 fatty acids 1000 MG capsule Take 2 g by mouth daily.      . MULTIPLE VITAMIN PO Take by mouth.      . Probiotic Product (PROBIOTIC FORMULA PO) Take by mouth.      . raloxifene (EVISTA) 60 MG tablet Take 60 mg by mouth daily.    . Tafluprost (ZIOPTAN) 0.0015 % SOLN Apply 1 drop to eye daily.    . VENTOLIN HFA 108 (90 BASE) MCG/ACT inhaler USE 2 PUFFS EVERY 4 TO 6 HOURS AS NEEDED FOR SHORTNESS OF BREATH 18 Inhaler 0  . doxycycline (VIBRA-TABS) 100 MG tablet Take 1 tablet (100 mg total) by mouth 2 (two) times daily. If needed or sinusitis 14 tablet 0  . MOVIPREP 100 G SOLR Take 1 kit (100 g total) by mouth once. (Patient not taking: Reported on 08/10/2014) 1 kit 0   No facility-administered medications prior to visit.      EXAM:  BP 102/80 (BP Location: Left Arm, Patient Position: Sitting, Cuff Size: Normal)   Pulse 80   Temp 98 F (36.7 C) (Oral)   Ht 5' 7.5" (1.715 m)   Wt 224 lb 3.2 oz (101.7 kg)   BMI 34.60 kg/m   Body mass index is 34.6 kg/m. Wt Readings from Last 3 Encounters:  09/25/16 224 lb 3.2 oz (101.7 kg)  02/04/16 221 lb (100.2 kg)  09/23/15 218 lb 8 oz (99.1 kg)    Physical Exam: Vital signs reviewed WJX:BJYN is a well-developed well-nourished alert cooperative    who appearsr stated age in no acute distress.  mildy allergic appearing  HEENT: normocephalic atraumatic , Eyes: PERRL EOM's full, conjunctiva clear, Nares: paten,t no deformity discharge or tenderness., Ears: no deformity EAC's clear TMs with normal landmarks. Mouth: clear OP, no lesions, edema.  Moist mucous membranes. Dentition in adequate repair. NECK: supple without masses, thyromegaly or bruits. CHEST/PULM:  Clear to auscultation and percussion breath sounds equal no wheeze , rales or rhonchi. No chest wall deformities or tenderness. Breast: normal by inspection . No dimpling, discharge, masses, tenderness or discharge . CV: PMI is nondisplaced, S1 S2 no gallops, murmurs,  rubs. Peripheral pulses are full without delay.No JVD .  ABDOMEN: Bowel sounds normal nontender  No guard or rebound, no  hepato splenomegal no CVA tenderness.  No hernia. Extremtities:  No clubbing cyanosis or edema, no acute joint swelling or redness no focal atrophy NEURO:  Oriented x3, cranial nerves 3-12 appear to be intact, no obvious focal weakness,gait within normal limits no abnormal reflexes or asymmetrical SKIN: No acute rashes normal turgor, color, no bruising or petechiae. PSYCH: Oriented, good eye contact, no obvious depression anxiety, cognition and judgment appear normal. LN: no cervical axillary inguinal adenopathy  Lab Results  Component Value Date   WBC 6.6 09/25/2016   HGB 13.9 09/25/2016   HCT 41.8 09/25/2016   PLT 277.0 09/25/2016   GLUCOSE 102 (H) 09/25/2016   CHOL 199 09/25/2016   TRIG 81.0 09/25/2016   HDL 48.10 09/25/2016   LDLDIRECT 137.9 10/10/2008   LDLCALC 135 (H) 09/25/2016   ALT 14 09/25/2016   AST 15 09/25/2016   NA 140 09/25/2016   K 4.1 09/25/2016   CL 105 09/25/2016   CREATININE 1.13 09/25/2016   BUN 15 09/25/2016   CO2 31 09/25/2016   TSH 1.62 09/25/2016   HGBA1C 6.1 09/25/2016    BP Readings from Last 3 Encounters:  09/25/16 102/80  02/04/16 120/80  09/23/15 118/80    Lab results reviewed with patient   ASSESSMENT AND PLAN:  Discussed the following assessment and plan:  Visit for preventive health examination - Plan: Basic metabolic panel, CBC with Differential/Platelet, Hemoglobin A1c, Hepatic function panel, Lipid panel, TSH  Medication management - Plan: Basic metabolic panel, CBC with Differential/Platelet, Hemoglobin A1c, Hepatic function panel, Lipid panel, TSH  Fasting hyperglycemia - Plan: Basic metabolic panel, CBC with Differential/Platelet, Hemoglobin A1c, Hepatic function panel, Lipid panel, TSH  Seasonal allergic rhinitis, unspecified chronicity, unspecified trigger Disc   New protochol of getting labs at the visit    Make sure my chart is active and  To be able to ask ? About lab results  As needed  If all ok then check up in a year    lsi in the interim .   seh can call contact us if allergies still and issue  After INCS and can add singular as a trial Patient Care Team: Burnis Medin, MD as PCP - General Lafayette Dragon, MD (Inactive) (Gastroenterology) Servando Salina, MD (Obstetrics and Gynecology) Sherren Mocha, MD (Cardiology) Patient Instructions   Attend to life style intervnetion.  Healthy lifestyle includes : At least 150 minutes of exercise weeks  , weight at healthy levels, which is usually   BMI 19-25. Avoid trans fats and processed foods;  Increase fresh fruits and veges to 5 servings per day. And avoid sweet beverages including tea and juice. Mediterranean diet with olive oil and nuts have been noted to be heart and brain healthy . Avoid tobacco products . Limit  alcohol to  7 per week for women and 14 servings for men.  Get adequate sleep . Wear seat belts . Don't text and drive .    In regard to allergies okay with your eye doctor begin nasal cortisone to take 2 sprays in each side of the nose every day during the season. You can take this in addition to your antihistamine. You may take 1-2 weeks for the nasal spray to work. If you're still not controlled contact us we can try adding on Singulair prescription take once a day as a pill. If you're having to melena on her such wear mask. Air-conditioning usually filters out pollen.  BU where there is a new shingles vaccine out there  if your insurance reimbursed visit and you want to get this we can write a prescription or gives out vaccine. When the results of your blood work is back put them on my chart a you can send [place] messages of concern and questions. Healthy weight loss exercise is going to help your blood sugars in the risk of disease in the future. Checkup in a year call ahead to see if we are able to do laboratory studies  had a time were not.    Standley Brooking. Panosh M.D.

## 2016-09-25 ENCOUNTER — Ambulatory Visit (INDEPENDENT_AMBULATORY_CARE_PROVIDER_SITE_OTHER): Payer: 59 | Admitting: Internal Medicine

## 2016-09-25 ENCOUNTER — Encounter: Payer: Self-pay | Admitting: Internal Medicine

## 2016-09-25 VITALS — BP 102/80 | HR 80 | Temp 98.0°F | Ht 67.5 in | Wt 224.2 lb

## 2016-09-25 DIAGNOSIS — R7301 Impaired fasting glucose: Secondary | ICD-10-CM | POA: Diagnosis not present

## 2016-09-25 DIAGNOSIS — J302 Other seasonal allergic rhinitis: Secondary | ICD-10-CM | POA: Diagnosis not present

## 2016-09-25 DIAGNOSIS — Z0000 Encounter for general adult medical examination without abnormal findings: Secondary | ICD-10-CM | POA: Diagnosis not present

## 2016-09-25 DIAGNOSIS — Z79899 Other long term (current) drug therapy: Secondary | ICD-10-CM | POA: Diagnosis not present

## 2016-09-25 LAB — TSH: TSH: 1.62 u[IU]/mL (ref 0.35–4.50)

## 2016-09-25 LAB — BASIC METABOLIC PANEL
BUN: 15 mg/dL (ref 6–23)
CALCIUM: 9.6 mg/dL (ref 8.4–10.5)
CO2: 31 mEq/L (ref 19–32)
CREATININE: 1.13 mg/dL (ref 0.40–1.20)
Chloride: 105 mEq/L (ref 96–112)
GFR: 64.1 mL/min (ref >60.00)
Glucose, Bld: 102 mg/dL — ABNORMAL HIGH (ref 70–99)
Potassium: 4.1 mEq/L (ref 3.5–5.1)
Sodium: 140 mEq/L (ref 135–145)

## 2016-09-25 LAB — CBC WITH DIFFERENTIAL/PLATELET
BASOS ABS: 0 10*3/uL (ref 0.0–0.1)
BASOS PCT: 0.4 % (ref 0.0–3.0)
EOS ABS: 0.1 10*3/uL (ref 0.0–0.7)
Eosinophils Relative: 1.3 % (ref 0.0–5.0)
HEMATOCRIT: 41.8 % (ref 36.0–46.0)
HEMOGLOBIN: 13.9 g/dL (ref 12.0–15.0)
LYMPHS PCT: 40.5 % (ref 12.0–46.0)
Lymphs Abs: 2.7 10*3/uL (ref 0.7–4.0)
MCHC: 33.2 g/dL (ref 30.0–36.0)
MCV: 84.7 fl (ref 78.0–100.0)
MONO ABS: 0.4 10*3/uL (ref 0.1–1.0)
Monocytes Relative: 6.3 % (ref 3.0–12.0)
Neutro Abs: 3.4 10*3/uL (ref 1.4–7.7)
Neutrophils Relative %: 51.5 % (ref 43.0–77.0)
Platelets: 277 10*3/uL (ref 150.0–400.0)
RBC: 4.94 Mil/uL (ref 3.87–5.11)
RDW: 13.5 % (ref 11.5–15.5)
WBC: 6.6 10*3/uL (ref 4.0–10.5)

## 2016-09-25 LAB — HEPATIC FUNCTION PANEL
ALT: 14 U/L (ref 0–35)
AST: 15 U/L (ref 0–37)
Albumin: 4.3 g/dL (ref 3.5–5.2)
Alkaline Phosphatase: 63 U/L (ref 39–117)
BILIRUBIN DIRECT: 0.1 mg/dL (ref 0.0–0.3)
BILIRUBIN TOTAL: 0.4 mg/dL (ref 0.2–1.2)
Total Protein: 7.2 g/dL (ref 6.0–8.3)

## 2016-09-25 LAB — LIPID PANEL
Cholesterol: 199 mg/dL (ref 0–200)
HDL: 48.1 mg/dL (ref >39.00)
LDL Cholesterol: 135 mg/dL — ABNORMAL HIGH (ref 0–99)
NonHDL: 150.93
Total CHOL/HDL Ratio: 4
Triglycerides: 81 mg/dL (ref 0.0–149.0)
VLDL: 16.2 mg/dL (ref 0.0–40.0)

## 2016-09-25 LAB — HEMOGLOBIN A1C: HEMOGLOBIN A1C: 6.1 % (ref 4.6–6.5)

## 2016-09-25 NOTE — Patient Instructions (Signed)
  Attend to life style intervnetion.  Healthy lifestyle includes : At least 150 minutes of exercise weeks  , weight at healthy levels, which is usually   BMI 19-25. Avoid trans fats and processed foods;  Increase fresh fruits and veges to 5 servings per day. And avoid sweet beverages including tea and juice. Mediterranean diet with olive oil and nuts have been noted to be heart and brain healthy . Avoid tobacco products . Limit  alcohol to  7 per week for women and 14 servings for men.  Get adequate sleep . Wear seat belts . Don't text and drive .    In regard to allergies okay with your eye doctor begin nasal cortisone to take 2 sprays in each side of the nose every day during the season. You can take this in addition to your antihistamine. You may take 1-2 weeks for the nasal spray to work. If you're still not controlled contact us we can try adding on Singulair prescription take once a day as a pill. If you're having to melena on her such wear mask. Air-conditioning usually filters out pollen.  BU where there is a new shingles vaccine out there if your insurance reimbursed visit and you want to get this we can write a prescription or gives out vaccine. When the results of your blood work is back put them on my chart a you can send [place] messages of concern and questions. Healthy weight loss exercise is going to help your blood sugars in the risk of disease in the future. Checkup in a year call ahead to see if we are able to do laboratory studies had a time were not.

## 2016-12-11 DIAGNOSIS — H40053 Ocular hypertension, bilateral: Secondary | ICD-10-CM | POA: Diagnosis not present

## 2017-05-06 DIAGNOSIS — Z01419 Encounter for gynecological examination (general) (routine) without abnormal findings: Secondary | ICD-10-CM | POA: Diagnosis not present

## 2017-07-09 DIAGNOSIS — M8588 Other specified disorders of bone density and structure, other site: Secondary | ICD-10-CM | POA: Diagnosis not present

## 2017-07-09 DIAGNOSIS — Z1231 Encounter for screening mammogram for malignant neoplasm of breast: Secondary | ICD-10-CM | POA: Diagnosis not present

## 2017-07-20 DIAGNOSIS — H401131 Primary open-angle glaucoma, bilateral, mild stage: Secondary | ICD-10-CM | POA: Diagnosis not present

## 2017-09-21 DIAGNOSIS — H401131 Primary open-angle glaucoma, bilateral, mild stage: Secondary | ICD-10-CM | POA: Diagnosis not present

## 2017-09-29 ENCOUNTER — Encounter: Payer: Self-pay | Admitting: Internal Medicine

## 2017-11-24 DIAGNOSIS — N6320 Unspecified lump in the left breast, unspecified quadrant: Secondary | ICD-10-CM | POA: Diagnosis not present

## 2017-12-02 DIAGNOSIS — R922 Inconclusive mammogram: Secondary | ICD-10-CM | POA: Diagnosis not present

## 2017-12-02 DIAGNOSIS — N6012 Diffuse cystic mastopathy of left breast: Secondary | ICD-10-CM | POA: Diagnosis not present

## 2017-12-17 NOTE — Progress Notes (Signed)
Chief Complaint  Patient presents with  . Annual Exam    HPI: Patient  Hannah [name]  57 y.o. comes in today for Preventive Health Care visit   Using inhaler  As needed with cold  And sick   .   High eye pressure .  Dr Clydene Laming   [facility] .   Taking calcium supp  Has osteopenia and  Hysterectomy   ? Check vit d level? doesnt diarly some  But not milk.  Health Maintenance  Topic Date Due  . Hepatitis C Screening  Jun 26, 1960  . HIV Screening  11/26/1975  . MAMMOGRAM  06/30/2017  . INFLUENZA VACCINE  01/20/2018  . PAP SMEAR  03/23/2019  . COLONOSCOPY  02/23/2020  . TETANUS/TDAP  08/10/2024   Health Maintenance Review LIFESTYLE:  Exercise:  Walking not regular .  Tobacco/ETS: husband smokes out side .   Outside .  Alcohol:   Once a month  Sugar beverages: once a day soda  Tea.   Sleep: 6  Drug use: no HH of   4    2 pets  Work: 55 hours   ROS:  GEN/ HEENT: No fever, significant weight changes sweats headaches vision problems hearing changes, CV/ PULM; No chest pain shortness of breath cough, syncope,edema  change in exercise tolerance. GI /GU: No adominal pain, vomiting, change in bowel habits. No blood in the stool. No significant GU symptoms. SKIN/HEME: ,no acute skin rashes suspicious lesions or bleeding. No lymphadenopathy, nodules, masses.  NEURO/ PSYCH:  No neurologic signs such as weakness numbness. No depression anxiety. IMM/ Allergy: No unusual infections.  Allergy .   REST of 12 system review negative except as per HPI   Past Medical History:  Diagnosis Date  . Asthma   . Chest pain, atypical   . CHEST PAIN-PRECORDIAL 10/26/2008   Qualifier: Diagnosis of  By: Burt Knack, MD, Clayburn Pert   . Chronic constipation   . Glaucoma   . H/O: hysterectomy 2011   presented with irregular bleeding  . ICP (infantile cerebral palsy) (HCC)    elevated  . Iron deficiency anemia   . MENOPAUSE, SURGICAL 01/06/2010   Qualifier: Diagnosis of  By: Regis Bill MD,  Standley Brooking   . NEOPLASM, MALIGNANT, UTERUS, HX OF 01/06/2010   Qualifier: Diagnosis of  By: Regis Bill MD, Standley Brooking   . Nevus, atypical   . Osteochondroma   . OSTEOCHONDROMA 10/10/2008   Qualifier: Diagnosis of  By: Ronnald Ramp CMA, Chemira    . Rhinitis   . Uterine cancer [facility])     Past Surgical History:  Procedure Laterality Date  . CESAREAN SECTION    . FOOT SURGERY    . MYOMECTOMY    . TOTAL VAGINAL HYSTERECTOMY  2011  . TUBAL LIGATION    . UMBILICAL HERNIA REPAIR      Family History  Problem Relation Age of Onset  . Hypertension Mother        deceased  . Alcohol abuse Mother   . Hypertension Sister   . Diabetes Father        died from complications of dm, amputation and sepsis in his 39's   . Colon polyps Unknown   . Breast cancer Maternal Aunt     Social History   Socioeconomic History  . Marital status: Married    Spouse name: Not on file  . Number of children: Not on file  . Years of education: Not on file  . Highest education level: Not on file  Occupational History  . Not on file  Social Needs  . Financial resource strain: Not on file  . Food insecurity:    Worry: Not on file    Inability: Not on file  . Transportation needs:    Medical: Not on file    Non-medical: Not on file  Tobacco Use  . Smoking status: Never Smoker  . Smokeless tobacco: Never Used  Substance and Sexual Activity  . Alcohol use: Yes    Alcohol/week: 0.0 oz    Comment: socially  . Drug use: No  . Sexual activity: Not on file  Lifestyle  . Physical activity:    Days per week: Not on file    Minutes per session: Not on file  . Stress: Not on file  Relationships  . Social connections:    Talks on phone: Not on file    Gets together: Not on file    Attends religious service: Not on file    Active member of club or organization: Not on file    Attends meetings of clubs or organizations: Not on file    Relationship status: Not on file  Other Topics Concern  . Not on file  Social  History Narrative   The patient works as a Engineer, structural.     She has been in the profession for 25 years.     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    She does not smoke cigarettes or use recreational drugs.     She drinks alcohol on rare occasions with 1 glass of wine. Approx. Once a month.  She does not do regular exercise.   recently    HH of 3 -4hysband and 12 yo son  Has exchange student from [place] now    Pet poodle   Husband is a smoker   Hannah [name] is married with 3 children, ages  One fr college another on her own       hh of 4     Outpatient Medications Prior to Visit  Medication Sig Dispense Refill  . Biotin 5 MG TABS Take by mouth.      Marland Kitchen CALCIUM PO Take by mouth.    . Cholecalciferol (VITAMIN D PO) Take by mouth daily.     . fish oil-omega-3 fatty acids 1000 MG capsule Take 2 g by mouth daily.      Marland Kitchen MAGNESIUM PO Take 600 mg by mouth daily.    . MULTIPLE VITAMIN PO Take by mouth.      . Probiotic Product (PROBIOTIC FORMULA PO) Take by mouth.      . raloxifene (EVISTA) 60 MG tablet Take 60 mg by mouth daily.    . Tafluprost (ZIOPTAN) 0.0015 % SOLN Apply 1 drop to eye daily.    . VENTOLIN HFA 108 (90 BASE) MCG/ACT inhaler USE 2 PUFFS EVERY 4 TO 6 HOURS AS NEEDED FOR SHORTNESS OF BREATH 18 Inhaler 0   No facility-administered medications prior to visit.      EXAM:  BP 112/84   Pulse 76   Temp 98.2 F (36.8 C)   Ht 5' 7.5" (1.715 m)   Wt 216 lb (98 kg)   BMI 33.33 kg/m   Body mass index is 33.33 kg/m. Wt Readings from Last 3 Encounters:  12/20/17 216 lb (98 kg)  09/25/16 224 lb 3.2 oz (101.7 kg)  02/04/16 221 lb (100.2 kg)    Physical Exam: Vital signs reviewed [id number] is a well-developed well-nourished  alert cooperative    who appearsr stated age in no acute distress.  HEENT: normocephalic atraumatic , Eyes: PERRL EOM's full, conjunctiva minimal redness , Nares: paten,t no deformity discharge or tenderness., Ears: no  deformity EAC's clear TMs with normal landmarks. Mouth: clear OP, no lesions, edema. Low soft palate   Moist mucous membranes. Dentition in adequate repair. NECK: supple without masses, thyromegaly or bruits. CHEST/PULM:  Clear to auscultation and percussion breath sounds equal no wheeze , rales or rhonchi. No chest wall deformities or tenderness. Breast: normal by inspection . No dimpling, discharge, masses, tenderness or discharge . CV: PMI is nondisplaced, S1 S2 no gallops, murmurs, rubs. Peripheral pulses are full without delay.No JVD .  ABDOMEN: Bowel sounds normal nontender  No guard or rebound, no hepato splenomegal no CVA tenderness.  Extremtities:  No clubbing cyanosis or edema, no acute joint swelling or redness no focal atrophy NEURO:  Oriented x3, cranial nerves 3-12 appear to be intact, no obvious focal weakness,gait within normal limits no abnormal reflexes or asymmetrical SKIN: No acute rashes normal turgor, color, no bruising or petechiae. PSYCH: Oriented, good eye contact, no obvious depression anxiety, cognition and judgment appear normal. LN: no cervical axillary inguinal adenopathy    BP Readings from Last 3 Encounters:  12/20/17 112/84  09/25/16 102/80  02/04/16 120/80    Lab orders  reveiwed   ASSESSMENT AND PLAN:  Discussed the following assessment and plan:  Visit for preventive health examination - Plan: Basic metabolic panel, CBC with Differential/Platelet, Hemoglobin A1c, Hepatic function panel, Lipid panel, TSH, Hepatitis C antibody  Hyperglycemia - Plan: Basic metabolic panel, CBC with Differential/Platelet, Hemoglobin A1c, Hepatic function panel, Lipid panel, TSH, Hepatitis C antibody  Need for hepatitis C screening test - Plan: Hepatitis C antibody  Hyperlipidemia, unspecified hyperlipidemia type - Plan: Basic metabolic panel, CBC with Differential/Platelet, Hemoglobin A1c, Hepatic function panel, Lipid panel, TSH, Hepatitis C antibody  Osteopenia,  unspecified location - Plan: VITAMIN D 25 Hydroxy (Vit-D Deficiency, Fractures)  Estrogen deficiency - Plan: VITAMIN D 25 Hydroxy (Vit-D Deficiency, Fractures) Continue lifestyle intervention healthy eating and exercise .  Monitoring vit d and bg levels  Hep c cohort screening    shingrix in future .  Patient Care Team: Jacklin Zwick, Standley Brooking, MD as PCP - General Servando Salina, MD (Obstetrics and Gynecology) Sherren Mocha, MD (Cardiology) Patient Instructions  Continueattention lifestyle intervention healthy eating and exercise . Avoid sugar beverages .  Will notify you  of labs when available.   Contact us when want shinglrix vaccine  To make appt and check our supply .    Health Maintenance, Female Adopting a healthy lifestyle and getting preventive care can go a long way to promote health and wellness. Talk with your health care provider about what schedule of regular examinations is right for you. This is a good chance for you to check in with your provider about disease prevention and staying healthy. In between checkups, there are plenty of things you can do on your own. Experts have done a lot of research about which lifestyle changes and preventive measures are most likely to keep you healthy. Ask your health care provider for more information. Weight and diet Eat a healthy diet  Be sure to include plenty of vegetables, fruits, low-fat dairy products, and lean protein.  Do not eat a lot of foods high in solid fats, added sugars, or salt.  Get regular exercise. This is one of the most important things you can do for  your health. ? Most adults should exercise for at least 150 minutes each week. The exercise should increase your heart rate and make you sweat (moderate-intensity exercise). ? Most adults should also do strengthening exercises at least twice a week. This is in addition to the moderate-intensity exercise.  Maintain a healthy weight  Body mass index (BMI) is a  measurement that can be used to identify possible weight problems. It estimates body fat based on height and weight. Your health care provider can help determine your BMI and help you achieve or maintain a healthy weight.  For females 25 years of age and older: ? A BMI below 18.5 is considered underweight. ? A BMI of 18.5 to 24.9 is normal. ? A BMI of 25 to 29.9 is considered overweight. ? A BMI of 30 and above is considered obese.  Watch levels of cholesterol and blood lipids  You should start having your blood tested for lipids and cholesterol at 57 years of age, then have this test every 5 years.  You may need to have your cholesterol levels checked more often if: ? Your lipid or cholesterol levels are high. ? You are older than 57 years of age. ? You are at high risk for heart disease.  Cancer screening Lung Cancer  Lung cancer screening is recommended for adults 28-110 years old who are at high risk for lung cancer because of a history of smoking.  A yearly low-dose CT scan of the lungs is recommended for people who: ? Currently smoke. ? Have quit within the past 15 years. ? Have at least a 30-pack-year history of smoking. A pack year is smoking an average of one pack of cigarettes a day for 1 year.  Yearly screening should continue until it has been 15 years since you quit.  Yearly screening should stop if you develop a health problem that would prevent you from having lung cancer treatment.  Breast Cancer  Practice breast self-awareness. This means understanding how your breasts normally appear and feel.  It also means doing regular breast self-exams. Let your health care provider know about any changes, no matter how small.  If you are in your 20s or 30s, you should have a clinical breast exam (CBE) by a health care provider every 1-3 years as part of a regular health exam.  If you are 78 or older, have a CBE every year. Also consider having a breast X-ray (mammogram)  every year.  If you have a family history of breast cancer, talk to your health care provider about genetic screening.  If you are at high risk for breast cancer, talk to your health care provider about having an MRI and a mammogram every year.  Breast cancer gene (BRCA) assessment is recommended for women who have family members with BRCA-related cancers. BRCA-related cancers include: ? Breast. ? Ovarian. ? Tubal. ? Peritoneal cancers.  Results of the assessment will determine the need for genetic counseling and BRCA1 and BRCA2 testing.  Cervical Cancer Your health care provider may recommend that you be screened regularly for cancer of the pelvic organs (ovaries, uterus, and vagina). This screening involves a pelvic examination, including checking for microscopic changes to the surface of your cervix (Pap test). You may be encouraged to have this screening done every 3 years, beginning at age 48.  For women ages 72-65, health care providers may recommend pelvic exams and Pap testing every 3 years, or they may recommend the Pap and pelvic exam, combined with  testing for human papilloma virus (HPV), every 5 years. Some types of HPV increase your risk of cervical cancer. Testing for HPV may also be done on women of any age with unclear Pap test results.  Other health care providers may not recommend any screening for nonpregnant women who are considered low risk for pelvic cancer and who do not have symptoms. Ask your health care provider if a screening pelvic exam is right for you.  If you have had past treatment for cervical cancer or a condition that could lead to cancer, you need Pap tests and screening for cancer for at least 20 years after your treatment. If Pap tests have been discontinued, your risk factors (such as having a new sexual partner) need to be reassessed to determine if screening should resume. Some women have medical problems that increase the chance of getting cervical  cancer. In these cases, your health care provider may recommend more frequent screening and Pap tests.  Colorectal Cancer  This type of cancer can be detected and often prevented.  Routine colorectal cancer screening usually begins at 57 years of age and continues through 57 years of age.  Your health care provider may recommend screening at an earlier age if you have risk factors for colon cancer.  Your health care provider may also recommend using home test kits to check for hidden blood in the stool.  A small camera at the end of a tube can be used to examine your colon directly (sigmoidoscopy or colonoscopy). This is done to check for the earliest forms of colorectal cancer.  Routine screening usually begins at age 60.  Direct examination of the colon should be repeated every 5-10 years through 57 years of age. However, you may need to be screened more often if early forms of precancerous polyps or small growths are found.  Skin Cancer  Check your skin from head to toe regularly.  Tell your health care provider about any new moles or changes in moles, especially if there is a change in a mole's shape or color.  Also tell your health care provider if you have a mole that is larger than the size of a pencil eraser.  Always use sunscreen. Apply sunscreen liberally and repeatedly throughout the day.  Protect yourself by wearing long sleeves, pants, a wide-brimmed hat, and sunglasses whenever you are outside.  Heart disease, diabetes, and high blood pressure  High blood pressure causes heart disease and increases the risk of stroke. High blood pressure is more likely to develop in: ? People who have blood pressure in the high end of the normal range (130-139/85-89 mm Hg). ? People who are overweight or obese. ? People who are African American.  If you are 72-54 years of age, have your blood pressure checked every 3-5 years. If you are 77 years of age or older, have your blood  pressure checked every year. You should have your blood pressure measured twice-once when you are at a hospital or clinic, and once when you are not at a hospital or clinic. Record the average of the two measurements. To check your blood pressure when you are not at a hospital or clinic, you can use: ? An automated blood pressure machine at a pharmacy. ? A home blood pressure monitor.  If you are between 95 years and 67 years old, ask your health care provider if you should take aspirin to prevent strokes.  Have regular diabetes screenings. This involves taking a blood sample  to check your fasting blood sugar level. ? If you are at a normal weight and have a low risk for diabetes, have this test once every three years after 57 years of age. ? If you are overweight and have a high risk for diabetes, consider being tested at a younger age or more often. Preventing infection Hepatitis B  If you have a higher risk for hepatitis B, you should be screened for this virus. You are considered at high risk for hepatitis B if: ? You were born in a country where hepatitis B is common. Ask your health care provider which countries are considered high risk. ? Your parents were born in a high-risk country, and you have not been immunized against hepatitis B (hepatitis B vaccine). ? You have HIV or AIDS. ? You use needles to inject street drugs. ? You live with someone who has hepatitis B. ? You have had sex with someone who has hepatitis B. ? You get hemodialysis treatment. ? You take certain medicines for conditions, including cancer, organ transplantation, and autoimmune conditions.  Hepatitis C  Blood testing is recommended for: ? Everyone born from [birth year] through 1965. ? Anyone with known risk factors for hepatitis C.  Sexually transmitted infections (STIs)  You should be screened for sexually transmitted infections (STIs) including gonorrhea and chlamydia if: ? You are sexually active and are  younger than 57 years of age. ? You are older than 57 years of age and your health care provider tells you that you are at risk for this type of infection. ? Your sexual activity has changed since you were last screened and you are at an increased risk for chlamydia or gonorrhea. Ask your health care provider if you are at risk.  If you do not have HIV, but are at risk, it may be recommended that you take a prescription medicine daily to prevent HIV infection. This is called pre-exposure prophylaxis (PrEP). You are considered at risk if: ? You are sexually active and do not regularly use condoms or know the HIV status of your partner(s). ? You take drugs by injection. ? You are sexually active with a partner who has HIV.  Talk with your health care provider about whether you are at high risk of being infected with HIV. If you choose to begin PrEP, you should first be tested for HIV. You should then be tested every 3 months for as long as you are taking PrEP. Pregnancy  If you are premenopausal and you may become pregnant, ask your health care provider about preconception counseling.  If you may become pregnant, take 400 to 800 micrograms (mcg) of folic acid every day.  If you want to prevent pregnancy, talk to your health care provider about birth control (contraception). Osteoporosis and menopause  Osteoporosis is a disease in which the bones lose minerals and strength with aging. This can result in serious bone fractures. Your risk for osteoporosis can be identified using a bone density scan.  If you are 1 years of age or older, or if you are at risk for osteoporosis and fractures, ask your health care provider if you should be screened.  Ask your health care provider whether you should take a calcium or vitamin D supplement to lower your risk for osteoporosis.  Menopause may have certain physical symptoms and risks.  Hormone replacement therapy may reduce some of these symptoms and  risks. Talk to your health care provider about whether hormone replacement therapy is right  for you. Follow these instructions at home:  Schedule regular health, dental, and eye exams.  Stay current with your immunizations.  Do not use any tobacco products including cigarettes, chewing tobacco, or electronic cigarettes.  If you are pregnant, do not drink alcohol.  If you are breastfeeding, limit how much and how often you drink alcohol.  Limit alcohol intake to no more than 1 drink per day for nonpregnant women. One drink equals 12 ounces of beer, 5 ounces of wine, or 1 ounces of hard liquor.  Do not use street drugs.  Do not share needles.  Ask your health care provider for help if you need support or information about quitting drugs.  Tell your health care provider if you often feel depressed.  Tell your health care provider if you have ever been abused or do not feel safe at home. This information is not intended to replace advice given to you by your health care provider. Make sure you discuss any questions you have with your health care provider. Document Released: 12/22/2010 Document Revised: 11/14/2015 Document Reviewed: 03/12/2015 Elsevier Interactive Patient Education  2018 [employer]. Ciji Boston M.D.

## 2017-12-20 ENCOUNTER — Ambulatory Visit (INDEPENDENT_AMBULATORY_CARE_PROVIDER_SITE_OTHER): Payer: 59 | Admitting: Internal Medicine

## 2017-12-20 ENCOUNTER — Encounter: Payer: Self-pay | Admitting: Internal Medicine

## 2017-12-20 VITALS — BP 112/84 | HR 76 | Temp 98.2°F | Ht 67.5 in | Wt 216.0 lb

## 2017-12-20 DIAGNOSIS — R739 Hyperglycemia, unspecified: Secondary | ICD-10-CM | POA: Diagnosis not present

## 2017-12-20 DIAGNOSIS — Z1159 Encounter for screening for other viral diseases: Secondary | ICD-10-CM | POA: Diagnosis not present

## 2017-12-20 DIAGNOSIS — Z0000 Encounter for general adult medical examination without abnormal findings: Secondary | ICD-10-CM

## 2017-12-20 DIAGNOSIS — E785 Hyperlipidemia, unspecified: Secondary | ICD-10-CM

## 2017-12-20 DIAGNOSIS — E2839 Other primary ovarian failure: Secondary | ICD-10-CM

## 2017-12-20 DIAGNOSIS — M8580 Other specified disorders of bone density and structure, unspecified site: Secondary | ICD-10-CM

## 2017-12-20 LAB — BASIC METABOLIC PANEL
BUN: 15 mg/dL (ref 6–23)
CO2: 25 mEq/L (ref 19–32)
Calcium: 9.6 mg/dL (ref 8.4–10.5)
Chloride: 107 mEq/L (ref 96–112)
Creatinine, Ser: 1.08 mg/dL (ref 0.40–1.20)
GFR: 67.23 mL/min (ref >60.00)
Glucose, Bld: 108 mg/dL — ABNORMAL HIGH (ref 70–99)
POTASSIUM: 4.1 meq/L (ref 3.5–5.1)
SODIUM: 143 meq/L (ref 135–145)

## 2017-12-20 LAB — CBC WITH DIFFERENTIAL/PLATELET
Basophils Absolute: 0 10*3/uL (ref 0.0–0.1)
Basophils Relative: 0.4 % (ref 0.0–3.0)
Eosinophils Absolute: 0.1 10*3/uL (ref 0.0–0.7)
Eosinophils Relative: 2.1 % (ref 0.0–5.0)
HEMATOCRIT: 41.5 % (ref 36.0–46.0)
HEMOGLOBIN: 13.9 g/dL (ref 12.0–15.0)
LYMPHS PCT: 43.2 % (ref 12.0–46.0)
Lymphs Abs: 2.2 10*3/uL (ref 0.7–4.0)
MCHC: 33.5 g/dL (ref 30.0–36.0)
MCV: 84.1 fl (ref 78.0–100.0)
MONOS PCT: 5.8 % (ref 3.0–12.0)
Monocytes Absolute: 0.3 10*3/uL (ref 0.1–1.0)
Neutro Abs: 2.5 10*3/uL (ref 1.4–7.7)
Neutrophils Relative %: 48.5 % (ref 43.0–77.0)
Platelets: 275 10*3/uL (ref 150.0–400.0)
RBC: 4.94 Mil/uL (ref 3.87–5.11)
RDW: 14.3 % (ref 11.5–15.5)
WBC: 5.1 10*3/uL (ref 4.0–10.5)

## 2017-12-20 LAB — HEPATIC FUNCTION PANEL
ALBUMIN: 4.4 g/dL (ref 3.5–5.2)
ALK PHOS: 68 U/L (ref 39–117)
ALT: 13 U/L (ref 0–35)
AST: 14 U/L (ref 0–37)
Bilirubin, Direct: 0.1 mg/dL (ref 0.0–0.3)
TOTAL PROTEIN: 6.9 g/dL (ref 6.0–8.3)
Total Bilirubin: 0.4 mg/dL (ref 0.2–1.2)

## 2017-12-20 LAB — VITAMIN D 25 HYDROXY (VIT D DEFICIENCY, FRACTURES): VITD: 37.33 ng/mL (ref 30.00–100.00)

## 2017-12-20 LAB — LIPID PANEL
Cholesterol: 201 mg/dL — ABNORMAL HIGH (ref 0–200)
HDL: 45.7 mg/dL (ref >39.00)
LDL Cholesterol: 140 mg/dL — ABNORMAL HIGH (ref 0–99)
NONHDL: 155.19
Total CHOL/HDL Ratio: 4
Triglycerides: 78 mg/dL (ref 0.0–149.0)
VLDL: 15.6 mg/dL (ref 0.0–40.0)

## 2017-12-20 LAB — TSH: TSH: 1.29 u[IU]/mL (ref 0.35–4.50)

## 2017-12-20 LAB — HEMOGLOBIN A1C: HEMOGLOBIN A1C: 6.2 % (ref 4.6–6.5)

## 2017-12-20 NOTE — Patient Instructions (Signed)
Continueattention lifestyle intervention healthy eating and exercise . Avoid sugar beverages .  Will notify you  of labs when available.   Contact us when want shinglrix vaccine  To make appt and check our supply .    Health Maintenance, Female Adopting a healthy lifestyle and getting preventive care can go a long way to promote health and wellness. Talk with your health care provider about what schedule of regular examinations is right for you. This is a good chance for you to check in with your provider about disease prevention and staying healthy. In between checkups, there are plenty of things you can do on your own. Experts have done a lot of research about which lifestyle changes and preventive measures are most likely to keep you healthy. Ask your health care provider for more information. Weight and diet Eat a healthy diet  Be sure to include plenty of vegetables, fruits, low-fat dairy products, and lean protein.  Do not eat a lot of foods high in solid fats, added sugars, or salt.  Get regular exercise. This is one of the most important things you can do for your health. ? Most adults should exercise for at least 150 minutes each week. The exercise should increase your heart rate and make you sweat (moderate-intensity exercise). ? Most adults should also do strengthening exercises at least twice a week. This is in addition to the moderate-intensity exercise.  Maintain a healthy weight  Body mass index (BMI) is a measurement that can be used to identify possible weight problems. It estimates body fat based on height and weight. Your health care provider can help determine your BMI and help you achieve or maintain a healthy weight.  For females 47 years of age and older: ? A BMI below 18.5 is considered underweight. ? A BMI of 18.5 to 24.9 is normal. ? A BMI of 25 to 29.9 is considered overweight. ? A BMI of 30 and above is considered obese.  Watch levels of cholesterol and  blood lipids  You should start having your blood tested for lipids and cholesterol at 57 years of age, then have this test every 5 years.  You may need to have your cholesterol levels checked more often if: ? Your lipid or cholesterol levels are high. ? You are older than 57 years of age. ? You are at high risk for heart disease.  Cancer screening Lung Cancer  Lung cancer screening is recommended for adults 20-53 years old who are at high risk for lung cancer because of a history of smoking.  A yearly low-dose CT scan of the lungs is recommended for people who: ? Currently smoke. ? Have quit within the past 15 years. ? Have at least a 30-pack-year history of smoking. A pack year is smoking an average of one pack of cigarettes a day for 1 year.  Yearly screening should continue until it has been 15 years since you quit.  Yearly screening should stop if you develop a health problem that would prevent you from having lung cancer treatment.  Breast Cancer  Practice breast self-awareness. This means understanding how your breasts normally appear and feel.  It also means doing regular breast self-exams. Let your health care provider know about any changes, no matter how small.  If you are in your 20s or 30s, you should have a clinical breast exam (CBE) by a health care provider every 1-3 years as part of a regular health exam.  If you are 40 or  older, have a CBE every year. Also consider having a breast X-ray (mammogram) every year.  If you have a family history of breast cancer, talk to your health care provider about genetic screening.  If you are at high risk for breast cancer, talk to your health care provider about having an MRI and a mammogram every year.  Breast cancer gene (BRCA) assessment is recommended for women who have family members with BRCA-related cancers. BRCA-related cancers include: ? Breast. ? Ovarian. ? Tubal. ? Peritoneal cancers.  Results of the assessment  will determine the need for genetic counseling and BRCA1 and BRCA2 testing.  Cervical Cancer Your health care provider may recommend that you be screened regularly for cancer of the pelvic organs (ovaries, uterus, and vagina). This screening involves a pelvic examination, including checking for microscopic changes to the surface of your cervix (Pap test). You may be encouraged to have this screening done every 3 years, beginning at age 21.  For women ages 52-65, health care providers may recommend pelvic exams and Pap testing every 3 years, or they may recommend the Pap and pelvic exam, combined with testing for human papilloma virus (HPV), every 5 years. Some types of HPV increase your risk of cervical cancer. Testing for HPV may also be done on women of any age with unclear Pap test results.  Other health care providers may not recommend any screening for nonpregnant women who are considered low risk for pelvic cancer and who do not have symptoms. Ask your health care provider if a screening pelvic exam is right for you.  If you have had past treatment for cervical cancer or a condition that could lead to cancer, you need Pap tests and screening for cancer for at least 20 years after your treatment. If Pap tests have been discontinued, your risk factors (such as having a new sexual partner) need to be reassessed to determine if screening should resume. Some women have medical problems that increase the chance of getting cervical cancer. In these cases, your health care provider may recommend more frequent screening and Pap tests.  Colorectal Cancer  This type of cancer can be detected and often prevented.  Routine colorectal cancer screening usually begins at 57 years of age and continues through 57 years of age.  Your health care provider may recommend screening at an earlier age if you have risk factors for colon cancer.  Your health care provider may also recommend using home test kits to  check for hidden blood in the stool.  A small camera at the end of a tube can be used to examine your colon directly (sigmoidoscopy or colonoscopy). This is done to check for the earliest forms of colorectal cancer.  Routine screening usually begins at age 49.  Direct examination of the colon should be repeated every 5-10 years through 57 years of age. However, you may need to be screened more often if early forms of precancerous polyps or small growths are found.  Skin Cancer  Check your skin from head to toe regularly.  Tell your health care provider about any new moles or changes in moles, especially if there is a change in a mole's shape or color.  Also tell your health care provider if you have a mole that is larger than the size of a pencil eraser.  Always use sunscreen. Apply sunscreen liberally and repeatedly throughout the day.  Protect yourself by wearing long sleeves, pants, a wide-brimmed hat, and sunglasses whenever you are outside.  Heart disease, diabetes, and high blood pressure  High blood pressure causes heart disease and increases the risk of stroke. High blood pressure is more likely to develop in: ? People who have blood pressure in the high end of the normal range (130-139/85-89 mm Hg). ? People who are overweight or obese. ? People who are African American.  If you are 4-70 years of age, have your blood pressure checked every 3-5 years. If you are 38 years of age or older, have your blood pressure checked every year. You should have your blood pressure measured twice-once when you are at a hospital or clinic, and once when you are not at a hospital or clinic. Record the average of the two measurements. To check your blood pressure when you are not at a hospital or clinic, you can use: ? An automated blood pressure machine at a pharmacy. ? A home blood pressure monitor.  If you are between 34 years and 35 years old, ask your health care provider if you should  take aspirin to prevent strokes.  Have regular diabetes screenings. This involves taking a blood sample to check your fasting blood sugar level. ? If you are at a normal weight and have a low risk for diabetes, have this test once every three years after 57 years of age. ? If you are overweight and have a high risk for diabetes, consider being tested at a younger age or more often. Preventing infection Hepatitis B  If you have a higher risk for hepatitis B, you should be screened for this virus. You are considered at high risk for hepatitis B if: ? You were born in a country where hepatitis B is common. Ask your health care provider which countries are considered high risk. ? Your parents were born in a high-risk country, and you have not been immunized against hepatitis B (hepatitis B vaccine). ? You have HIV or AIDS. ? You use needles to inject street drugs. ? You live with someone who has hepatitis B. ? You have had sex with someone who has hepatitis B. ? You get hemodialysis treatment. ? You take certain medicines for conditions, including cancer, organ transplantation, and autoimmune conditions.  Hepatitis C  Blood testing is recommended for: ? Everyone born from [birth year] through 1965. ? Anyone with known risk factors for hepatitis C.  Sexually transmitted infections (STIs)  You should be screened for sexually transmitted infections (STIs) including gonorrhea and chlamydia if: ? You are sexually active and are younger than 57 years of age. ? You are older than 57 years of age and your health care provider tells you that you are at risk for this type of infection. ? Your sexual activity has changed since you were last screened and you are at an increased risk for chlamydia or gonorrhea. Ask your health care provider if you are at risk.  If you do not have HIV, but are at risk, it may be recommended that you take a prescription medicine daily to prevent HIV infection. This is called  pre-exposure prophylaxis (PrEP). You are considered at risk if: ? You are sexually active and do not regularly use condoms or know the HIV status of your partner(s). ? You take drugs by injection. ? You are sexually active with a partner who has HIV.  Talk with your health care provider about whether you are at high risk of being infected with HIV. If you choose to begin PrEP, you should first be tested for HIV.  You should then be tested every 3 months for as long as you are taking PrEP. Pregnancy  If you are premenopausal and you may become pregnant, ask your health care provider about preconception counseling.  If you may become pregnant, take 400 to 800 micrograms (mcg) of folic acid every day.  If you want to prevent pregnancy, talk to your health care provider about birth control (contraception). Osteoporosis and menopause  Osteoporosis is a disease in which the bones lose minerals and strength with aging. This can result in serious bone fractures. Your risk for osteoporosis can be identified using a bone density scan.  If you are 65 years of age or older, or if you are at risk for osteoporosis and fractures, ask your health care provider if you should be screened.  Ask your health care provider whether you should take a calcium or vitamin D supplement to lower your risk for osteoporosis.  Menopause may have certain physical symptoms and risks.  Hormone replacement therapy may reduce some of these symptoms and risks. Talk to your health care provider about whether hormone replacement therapy is right for you. Follow these instructions at home:  Schedule regular health, dental, and eye exams.  Stay current with your immunizations.  Do not use any tobacco products including cigarettes, chewing tobacco, or electronic cigarettes.  If you are pregnant, do not drink alcohol.  If you are breastfeeding, limit how much and how often you drink alcohol.  Limit alcohol intake to no more  than 1 drink per day for nonpregnant women. One drink equals 12 ounces of beer, 5 ounces of wine, or 1 ounces of hard liquor.  Do not use street drugs.  Do not share needles.  Ask your health care provider for help if you need support or information about quitting drugs.  Tell your health care provider if you often feel depressed.  Tell your health care provider if you have ever been abused or do not feel safe at home. This information is not intended to replace advice given to you by your health care provider. Make sure you discuss any questions you have with your health care provider. Document Released: 12/22/2010 Document Revised: 11/14/2015 Document Reviewed: 03/12/2015 Elsevier Interactive Patient Education  [employer].

## 2017-12-21 LAB — HEPATITIS C ANTIBODY
HEP C AB: NONREACTIVE
SIGNAL TO CUT-OFF: 0.01 (ref <1.00)

## 2018-01-05 ENCOUNTER — Ambulatory Visit (INDEPENDENT_AMBULATORY_CARE_PROVIDER_SITE_OTHER): Payer: 59 | Admitting: *Deleted

## 2018-01-05 DIAGNOSIS — Z23 Encounter for immunization: Secondary | ICD-10-CM | POA: Diagnosis not present

## 2018-01-05 NOTE — Progress Notes (Signed)
Per orders of Dr. Regis Bill, injection of Shingrix vaccine given by Dorrene German. Patient tolerated injection well.

## 2018-01-27 DIAGNOSIS — Z79899 Other long term (current) drug therapy: Secondary | ICD-10-CM | POA: Diagnosis not present

## 2018-02-04 ENCOUNTER — Telehealth: Payer: Self-pay | Admitting: Family Medicine

## 2018-02-04 NOTE — Telephone Encounter (Signed)
Copied from [employer] [id number]. Topic: General - Other >> Feb 04, 2018 12:50 PM Bea Graff, NT wrote: Reason for CRM: Pt faxed over a form from her school on 01/31/18 to have the dates for her Tdap immunizations and she has not heard anything back and was wanting to make sure this was being done. She did receive confirmation that the fax went through.

## 2018-02-04 NOTE — Telephone Encounter (Signed)
Pt aware that form is complete but does not have the Dr signature.  Aware that this will be signed first thing Monday morning and will be mailed to her home address per her request.   Please advise Dr Regis Bill, thanks.  This is in your red folder.

## 2018-02-07 NOTE — Telephone Encounter (Signed)
Form has been signed and copy of immunizations that are in our record.

## 2018-02-08 NOTE — Telephone Encounter (Signed)
Form signed and placed in outgoing mail.  Copy sent to scan.  Nothing further needed.

## 2018-02-24 DIAGNOSIS — Z79899 Other long term (current) drug therapy: Secondary | ICD-10-CM | POA: Diagnosis not present

## 2018-02-24 DIAGNOSIS — H401131 Primary open-angle glaucoma, bilateral, mild stage: Secondary | ICD-10-CM | POA: Diagnosis not present

## 2018-04-08 ENCOUNTER — Ambulatory Visit (INDEPENDENT_AMBULATORY_CARE_PROVIDER_SITE_OTHER): Payer: 59

## 2018-04-08 DIAGNOSIS — Z23 Encounter for immunization: Secondary | ICD-10-CM | POA: Diagnosis not present

## 2018-04-08 NOTE — Progress Notes (Signed)
Per orders by Dr. Shanon Ace, patient given 2nd Shingrix vaccine in left deltoid by Anibal Henderson, CMA. Patient tolerated well.

## 2018-05-12 DIAGNOSIS — Z719 Counseling, unspecified: Secondary | ICD-10-CM | POA: Diagnosis not present

## 2018-05-27 DIAGNOSIS — M8580 Other specified disorders of bone density and structure, unspecified site: Secondary | ICD-10-CM | POA: Diagnosis not present

## 2018-05-27 DIAGNOSIS — Z01411 Encounter for gynecological examination (general) (routine) with abnormal findings: Secondary | ICD-10-CM | POA: Diagnosis not present

## 2018-07-11 DIAGNOSIS — Z79899 Other long term (current) drug therapy: Secondary | ICD-10-CM | POA: Diagnosis not present

## 2018-07-15 DIAGNOSIS — Z1231 Encounter for screening mammogram for malignant neoplasm of breast: Secondary | ICD-10-CM | POA: Diagnosis not present

## 2018-08-04 ENCOUNTER — Other Ambulatory Visit: Payer: Self-pay | Admitting: Nurse Practitioner

## 2018-08-04 ENCOUNTER — Ambulatory Visit
Admission: RE | Admit: 2018-08-04 | Discharge: 2018-08-04 | Disposition: A | Discharge status: Home / Self Care | Payer: No Typology Code available for payment source | Source: Ambulatory Visit | Attending: Nurse Practitioner | Admitting: Nurse Practitioner

## 2018-08-04 DIAGNOSIS — T1490XA Injury, unspecified, initial encounter: Secondary | ICD-10-CM

## 2018-10-12 DIAGNOSIS — L659 Nonscarring hair loss, unspecified: Secondary | ICD-10-CM | POA: Diagnosis not present

## 2018-10-12 DIAGNOSIS — L669 Cicatricial alopecia, unspecified: Secondary | ICD-10-CM | POA: Diagnosis not present

## 2018-10-26 DIAGNOSIS — L669 Cicatricial alopecia, unspecified: Secondary | ICD-10-CM | POA: Diagnosis not present

## 2018-12-23 NOTE — Progress Notes (Signed)
Chief Complaint  Patient presents with  . Annual Exam    pt is concerned about iron due to hair loss    HPI: Patient  Hannah [name]  58 y.o. comes in today for Preventive Health Care visit   Has had concern of  Hair thinning and losing  On a regimen6-8 weeks   Thinning  Dermatology did bx  And did  Alopecia  From  Trauma straighter   So using   .  injections nad topical rogaine.   [facility] derm. Had fu 4 weeks.      Had stress and  Other consideration and  Could she have been iron deficient? No bleeding.   Doing ok better  Was working on  Chiropractor and has a 58 yo foster   On adderall .Marland Kitchen Medication .  Not taking in enough nutrients.  Wondered if   Iron could be off.   Left leg injury with fx meniscus  Under care Dr Hannah [name]   No sob but needs refill albuterol in case to have on hand   Health Maintenance  Topic Date Due  . HIV Screening  11/[birth year]1975  . INFLUENZA VACCINE  01/21/2019  . PAP SMEAR-Modifier  03/23/2019  . COLONOSCOPY  02/23/2020  . MAMMOGRAM  07/09/2020  . TETANUS/TDAP  08/10/2024  . Hepatitis C Screening  Completed   Health Maintenance Review LIFESTYLE:  Exercise:   minmal    Torn meniscus.   Fractured   Femur  Tobacco/ETS:  no Alcohol:   rare Sugar beverages: soft drink  One evey other day .  Sleep: 6-7  Drug use: no HH of   3  1 dog  Work: about   50 - 60 hours   ROS:  No change vision see HPI GEN/ HEENT: No fever, significant weight changes sweats headaches vision problems hearing changes, CV/ PULM; No chest pain shortness of breath cough, syncope,edema  change in exercise tolerance. GI /GU: No adominal pain, vomiting, change in bowel habits. No blood in the stool. No significant GU symptoms. SKIN/HEME: ,no acute skin rashes suspicious lesions or bleeding. No lymphadenopathy, nodules, masses.  NEURO/ PSYCH:  No neurologic signs such as weakness numbness. No depression anxiety. IMM/ Allergy: No unusual infections.  Allergy .    REST of 12 system review negative except as per HPI   Past Medical History:  Diagnosis Date  . Asthma   . Chest pain, atypical   . CHEST PAIN-PRECORDIAL 10/26/2008   Qualifier: Diagnosis of  By: Burt Knack, MD, Clayburn Pert   . Chronic constipation   . Glaucoma   . H/O: hysterectomy 2011   presented with irregular bleeding  . ICP (infantile cerebral palsy) (HCC)    elevated  . Iron deficiency anemia   . MENOPAUSE, SURGICAL 01/06/2010   Qualifier: Diagnosis of  By: Regis Bill MD, Standley Brooking   . NEOPLASM, MALIGNANT, UTERUS, HX OF 01/06/2010   Qualifier: Diagnosis of  By: Regis Bill MD, Standley Brooking   . Nevus, atypical   . Osteochondroma   . OSTEOCHONDROMA 10/10/2008   Qualifier: Diagnosis of  By: Ronnald Ramp CMA, Chemira    . Rhinitis   . Uterine cancer [facility])     Past Surgical History:  Procedure Laterality Date  . CESAREAN SECTION    . FOOT SURGERY    . MYOMECTOMY    . TOTAL VAGINAL HYSTERECTOMY  2011  . TUBAL LIGATION    . UMBILICAL HERNIA REPAIR      Family History  Problem Relation Age  of Onset  . Hypertension Mother        deceased  . Alcohol abuse Mother   . Hypertension Sister   . Diabetes Father        died from complications of dm, amputation and sepsis in his 60's   . Colon polyps Unknown   . Breast cancer Maternal Aunt     Social History   Socioeconomic History  . Marital status: Married    Spouse name: Not on file  . Number of children: Not on file  . Years of education: Not on file  . Highest education level: Not on file  Occupational History  . Not on file  Social Needs  . Financial resource strain: Not on file  . Food insecurity    Worry: Not on file    Inability: Not on file  . Transportation needs    Medical: Not on file    Non-medical: Not on file  Tobacco Use  . Smoking status: Never Smoker  . Smokeless tobacco: Never Used  Substance and Sexual Activity  . Alcohol use: Yes    Alcohol/week: 0.0 standard drinks    Comment: socially  . Drug use: No  .  Sexual activity: Not on file  Lifestyle  . Physical activity    Days per week: Not on file    Minutes per session: Not on file  . Stress: Not on file  Relationships  . Social Herbalist on phone: Not on file    Gets together: Not on file    Attends religious service: Not on file    Active member of club or organization: Not on file    Attends meetings of clubs or organizations: Not on file    Relationship status: Not on file  Other Topics Concern  . 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    She does not smoke cigarettes or use recreational drugs.     She drinks alcohol on rare occasions with 1 glass of wine. Approx. Once a month.  She does not do regular exercise.   recently    HH of 3 -4hysband and 67 yo son  Has exchange student from [place] now    Pet poodle   Husband is a smoker   [name] is married with 3 children, ages  One fr college another on her own       hh of 4     Outpatient Medications Prior to Visit  Medication Sig Dispense Refill  . Biotin 5 MG TABS Take by mouth.      Marland Kitchen CALCIUM PO Take by mouth.    . Cholecalciferol (VITAMIN D PO) Take by mouth daily.     . fish oil-omega-3 fatty acids 1000 MG capsule Take 2 g by mouth daily.      Marland Kitchen MAGNESIUM PO Take 600 mg by mouth daily.    . minoxidil (ROGAINE) 2 % external solution Apply topically 2 (two) times daily.    . MULTIPLE VITAMIN PO Take by mouth.      . Probiotic Product (PROBIOTIC FORMULA PO) Take by mouth.      . raloxifene (EVISTA) 60 MG tablet Take 60 mg by mouth daily.    . Tafluprost (ZIOPTAN) 0.0015 % SOLN Apply 1 drop  to eye daily.    . VENTOLIN HFA 108 (90 BASE) MCG/ACT inhaler USE 2 PUFFS EVERY 4 TO 6 HOURS AS NEEDED FOR SHORTNESS OF BREATH 18 Inhaler 0  . ADDERALL XR 30 MG 24 hr capsule Take by mouth daily.     No  facility-administered medications prior to visit.      EXAM:  BP 122/68 (BP Location: Right Arm, Patient Position: Sitting, Cuff Size: Normal)   Pulse 86   Temp 98.7 F (37.1 C) (Oral)   Ht _0  (1.702 m)   Wt 207 lb (93.9 kg)   SpO2 98%   BMI 32.42 kg/m   Body mass index is 32.42 kg/m. Wt Readings from Last 3 Encounters:  12/26/18 207 lb (93.9 kg)  12/20/17 216 lb (98 kg)  09/25/16 224 lb 3.2 oz (101.7 kg)    Physical Exam: Vital signs reviewed EHU:DJSH is a well-developed well-nourished alert cooperative    who appearsr stated age in no acute distress.  HEENT: normocephalic atraumatic , Eyes: PERRL EOM's full, conjunctiva slightly red , Nares: paten,t no deformity discharge or tenderness., Ears: no deformity EAC's clear  Excoriation end of left canalTMs with normal landmarks. Mouth: deferredNECK: supple without masses, thyromegaly or bruits. CHEST/PULM:  Clear to auscultation and percussion breath sounds equal no wheeze , rales or rhonchi. No chest wall deformities or tenderness. Breast: normal by inspection . No dimpling, discharge, masses, tenderness or discharge . CV: PMI is nondisplaced, S1 S2 no gallops, murmurs, rubs. Peripheral pulses are full without delay.No JVD .  ABDOMEN: Bowel sounds normal nontender  No guard or rebound, no hepato splenomegal no CVA tenderness.   Extremtities:  No clubbing cyanosis or edema, left knee chronic swelling no rednessswelling or redness no focal atrophy NEURO:  Oriented x3, cranial nerves 3-12 appear to be intact, no obvious focal weakness,gait within normal limits no abnormal reflexes or asymmetrical SKIN: No acute rashes normal turgor, color, no bruising or petechiae.  Hair frintal recnession and traction lines top of head  Back is thick  PSYCH: Oriented, good eye contact, no obvious depression anxiety, cognition and judgment appear normal. LN: no cervical axillary inguinal adenopathy  Lab Results  Component Value Date   WBC 5.2  12/26/2018   HGB 13.5 12/26/2018   HCT 40.7 12/26/2018   PLT 260.0 12/26/2018   GLUCOSE 86 12/26/2018   CHOL 179 12/26/2018   TRIG 44.0 12/26/2018   HDL 53.90 12/26/2018   LDLDIRECT 137.9 10/10/2008   LDLCALC 116 (H) 12/26/2018   ALT 11 12/26/2018   AST 13 12/26/2018   NA 141 12/26/2018   K 4.4 12/26/2018   CL 105 12/26/2018   CREATININE 0.98 12/26/2018   BUN 20 12/26/2018   CO2 29 12/26/2018   TSH 1.60 12/26/2018   HGBA1C 5.8 12/26/2018    BP Readings from Last 3 Encounters:  12/26/18 122/68  12/20/17 112/84  09/25/16 102/80   Had tea and honey. This am .  Lab planreviewed with patient   ASSESSMENT AND PLAN:  Discussed the following assessment and plan:    ICD-10-CM   1. Visit for preventive health examination  F02.63 Basic metabolic panel    CBC with Differential/Platelet    Hemoglobin A1c    Hepatic function panel    Lipid panel    TSH    T4, free  2. Medication management  Z85.885 Basic metabolic panel    CBC with Differential/Platelet    Hemoglobin A1c    Hepatic function panel  Lipid panel    TSH    T4, free    IBC + Ferritin  3. Hyperglycemia  C37.6 Basic metabolic panel    CBC with Differential/Platelet    Hemoglobin A1c    Hepatic function panel    Lipid panel    TSH    T4, free    IBC + Ferritin  4. Hyperlipidemia, unspecified hyperlipidemia type  E83.1 Basic metabolic panel    CBC with Differential/Platelet    Hemoglobin A1c    Hepatic function panel    Lipid panel    TSH    T4, free    IBC + Ferritin  5. Hair loss  D17.6 Basic metabolic panel    CBC with Differential/Platelet    Hemoglobin A1c    Hepatic function panel    Lipid panel    TSH    T4, free    IBC + Ferritin  6. Screening for HIV without presence of risk factors  Z11.4 HIV Antibody (routine testing w rflx)  disc hcm and  Triggers hair loss doubt metabolic but checking for screen inc thyroid free t4     Non traumatic    Refill ventolin if needed  Patient Care Team:  Wister Hoefle, Standley Brooking, MD as PCP - General Servando Salina, MD (Obstetrics and Gynecology) Sherren Mocha, MD (Cardiology) Gaynelle Arabian, MD as Consulting Physician (Orthopedic Surgery) Patient Instructions  Will notify you  of labs when available.   Continue attention  tolifestyle intervention healthy eating and activity . Can do upper body weight to help even if knee is problematic  swimming is also a good activity .    Preventive Care 43-72 Years Old, Female Preventive care refers to visits with your health care provider and lifestyle choices that can promote health and wellness. This includes:  A yearly physical exam. This may also be called an annual well check.  Regular dental visits and eye exams.  Immunizations.  Screening for certain conditions.  Healthy lifestyle choices, such as eating a healthy diet, getting regular exercise, not using drugs or products that contain nicotine and tobacco, and limiting alcohol use. What can I expect for my preventive care visit? Physical exam Your health care provider will check your:  Height and weight. This may be used to calculate body mass index (BMI), which tells if you are at a healthy weight.  Heart rate and blood pressure.  Skin for abnormal spots. Counseling Your health care provider may ask you questions about your:  Alcohol, tobacco, and drug use.  Emotional well-being.  Home and relationship well-being.  Sexual activity.  Eating habits.  Work and work Statistician.  Method of birth control.  Menstrual cycle.  Pregnancy history. What immunizations do I need?  Influenza (flu) vaccine  This is recommended every year. Tetanus, diphtheria, and pertussis (Tdap) vaccine  You may need a Td booster every 10 years. Varicella (chickenpox) vaccine  You may need this if you have not been vaccinated. Zoster (shingles) vaccine  You may need this after age 31. Measles, mumps, and rubella (MMR) vaccine  You may  need at least one dose of MMR if you were born in 1957 or later. You may also need a second dose. Pneumococcal conjugate (PCV13) vaccine  You may need this if you have certain conditions and were not previously vaccinated. Pneumococcal polysaccharide (PPSV23) vaccine  You may need one or two doses if you smoke cigarettes or if you have certain conditions. Meningococcal conjugate (MenACWY) vaccine  You may need  this if you have certain conditions. Hepatitis A vaccine  You may need this if you have certain conditions or if you travel or work in places where you may be exposed to hepatitis A. Hepatitis B vaccine  You may need this if you have certain conditions or if you travel or work in places where you may be exposed to hepatitis B. Haemophilus influenzae type b (Hib) vaccine  You may need this if you have certain conditions. Human papillomavirus (HPV) vaccine  If recommended by your health care provider, you may need three doses over 6 months. You may receive vaccines as individual doses or as more than one vaccine together in one shot (combination vaccines). Talk with your health care provider about the risks and benefits of combination vaccines. What tests do I need? Blood tests  Lipid and cholesterol levels. These may be checked every 5 years, or more frequently if you are over 61 years old.  Hepatitis C test.  Hepatitis B test. Screening  Lung cancer screening. You may have this screening every year starting at age 66 if you have a 30-pack-year history of smoking and currently smoke or have quit within the past 15 years.  Colorectal cancer screening. All adults should have this screening starting at age 30 and continuing until age 32. Your health care provider may recommend screening at age 9 if you are at increased risk. You will have tests every 1-10 years, depending on your results and the type of screening test.  Diabetes screening. This is done by checking your blood  sugar (glucose) after you have not eaten for a while (fasting). You may have this done every 1-3 years.  Mammogram. This may be done every 1-2 years. Talk with your health care provider about when you should start having regular mammograms. This may depend on whether you have a family history of breast cancer.  BRCA-related cancer screening. This may be done if you have a family history of breast, ovarian, tubal, or peritoneal cancers.  Pelvic exam and Pap test. This may be done every 3 years starting at age 34. Starting at age 61, this may be done every 5 years if you have a Pap test in combination with an HPV test. Other tests  Sexually transmitted disease (STD) testing.  Bone density scan. This is done to screen for osteoporosis. You may have this scan if you are at high risk for osteoporosis. Follow these instructions at home: Eating and drinking  Eat a diet that includes fresh fruits and vegetables, whole grains, lean protein, and low-fat dairy.  Take vitamin and mineral supplements as recommended by your health care provider.  Do not drink alcohol if: ? Your health care provider tells you not to drink. ? You are pregnant, may be pregnant, or are planning to become pregnant.  If you drink alcohol: ? Limit how much you have to 0-1 drink a day. ? Be aware of how much alcohol is in your drink. In the [place] one drink equals one 12 oz bottle of beer (355 mL), one 5 oz glass of wine (148 mL), or one 1 oz glass of hard liquor (44 mL). Lifestyle  Take daily care of your teeth and gums.  Stay active. Exercise for at least 30 minutes on 5 or more days each week.  Do not use any products that contain nicotine or tobacco, such as cigarettes, e-cigarettes, and chewing tobacco. If you need help quitting, ask your health care provider.  If you are  sexually active, practice safe sex. Use a condom or other form of birth control (contraception) in order to prevent pregnancy and STIs (sexually  transmitted infections).  If told by your health care provider, take low-dose aspirin daily starting at age 34. What's next?  Visit your health care provider once a year for a well check visit.  Ask your health care provider how often you should have your eyes and teeth checked.  Stay up to date on all vaccines. This information is not intended to replace advice given to you by your health care provider. Make sure you discuss any questions you have with your health care provider. Document Released: 07/05/2015 Document Revised: 02/17/2018 Document Reviewed: 02/17/2018 Elsevier Patient Education  2020 [place] Ceaira Ernster M.D.

## 2018-12-26 ENCOUNTER — Other Ambulatory Visit: Payer: Self-pay

## 2018-12-26 ENCOUNTER — Ambulatory Visit (INDEPENDENT_AMBULATORY_CARE_PROVIDER_SITE_OTHER): Payer: 59 | Admitting: Internal Medicine

## 2018-12-26 ENCOUNTER — Encounter: Payer: Self-pay | Admitting: Internal Medicine

## 2018-12-26 VITALS — BP 122/68 | HR 86 | Temp 98.7°F | Ht 67.0 in | Wt 207.0 lb

## 2018-12-26 DIAGNOSIS — R739 Hyperglycemia, unspecified: Secondary | ICD-10-CM

## 2018-12-26 DIAGNOSIS — Z0000 Encounter for general adult medical examination without abnormal findings: Secondary | ICD-10-CM

## 2018-12-26 DIAGNOSIS — E785 Hyperlipidemia, unspecified: Secondary | ICD-10-CM

## 2018-12-26 DIAGNOSIS — L659 Nonscarring hair loss, unspecified: Secondary | ICD-10-CM | POA: Diagnosis not present

## 2018-12-26 DIAGNOSIS — Z79899 Other long term (current) drug therapy: Secondary | ICD-10-CM

## 2018-12-26 DIAGNOSIS — Z114 Encounter for screening for human immunodeficiency virus [HIV]: Secondary | ICD-10-CM

## 2018-12-26 LAB — BASIC METABOLIC PANEL
BUN: 20 mg/dL (ref 6–23)
CO2: 29 mEq/L (ref 19–32)
Calcium: 9.2 mg/dL (ref 8.4–10.5)
Chloride: 105 mEq/L (ref 96–112)
Creatinine, Ser: 0.98 mg/dL (ref 0.40–1.20)
GFR: 70.51 mL/min (ref >60.00)
Glucose, Bld: 86 mg/dL (ref 70–99)
Potassium: 4.4 mEq/L (ref 3.5–5.1)
Sodium: 141 mEq/L (ref 135–145)

## 2018-12-26 LAB — CBC WITH DIFFERENTIAL/PLATELET
Basophils Absolute: 0 10*3/uL (ref 0.0–0.1)
Basophils Relative: 0.5 % (ref 0.0–3.0)
Eosinophils Absolute: 0.2 10*3/uL (ref 0.0–0.7)
Eosinophils Relative: 3.3 % (ref 0.0–5.0)
HCT: 40.7 % (ref 36.0–46.0)
Hemoglobin: 13.5 g/dL (ref 12.0–15.0)
Lymphocytes Relative: 38.6 % (ref 12.0–46.0)
Lymphs Abs: 2 10*3/uL (ref 0.7–4.0)
MCHC: 33.2 g/dL (ref 30.0–36.0)
MCV: 86.9 fl (ref 78.0–100.0)
Monocytes Absolute: 0.4 10*3/uL (ref 0.1–1.0)
Monocytes Relative: 6.7 % (ref 3.0–12.0)
Neutro Abs: 2.7 10*3/uL (ref 1.4–7.7)
Neutrophils Relative %: 50.9 % (ref 43.0–77.0)
Platelets: 260 10*3/uL (ref 150.0–400.0)
RBC: 4.68 Mil/uL (ref 3.87–5.11)
RDW: 13.4 % (ref 11.5–15.5)
WBC: 5.2 10*3/uL (ref 4.0–10.5)

## 2018-12-26 LAB — HEPATIC FUNCTION PANEL
ALT: 11 U/L (ref 0–35)
AST: 13 U/L (ref 0–37)
Albumin: 4.4 g/dL (ref 3.5–5.2)
Alkaline Phosphatase: 62 U/L (ref 39–117)
Bilirubin, Direct: 0.1 mg/dL (ref 0.0–0.3)
Total Bilirubin: 0.4 mg/dL (ref 0.2–1.2)
Total Protein: 7 g/dL (ref 6.0–8.3)

## 2018-12-26 LAB — IBC + FERRITIN
Ferritin: 168.3 ng/mL (ref 10.0–291.0)
Iron: 75 ug/dL (ref 42–145)
Saturation Ratios: 21.9 % (ref 20.0–50.0)
Transferrin: 245 mg/dL (ref 212.0–360.0)

## 2018-12-26 LAB — HEMOGLOBIN A1C: Hgb A1c MFr Bld: 5.8 % (ref 4.6–6.5)

## 2018-12-26 LAB — LIPID PANEL
Cholesterol: 179 mg/dL (ref 0–200)
HDL: 53.9 mg/dL (ref >39.00)
LDL Cholesterol: 116 mg/dL — ABNORMAL HIGH (ref 0–99)
NonHDL: 125.24
Total CHOL/HDL Ratio: 3
Triglycerides: 44 mg/dL (ref 0.0–149.0)
VLDL: 8.8 mg/dL (ref 0.0–40.0)

## 2018-12-26 LAB — TSH: TSH: 1.6 u[IU]/mL (ref 0.35–4.50)

## 2018-12-26 LAB — T4, FREE: Free T4: 0.74 ng/dL (ref 0.60–1.60)

## 2018-12-26 MED ORDER — ALBUTEROL SULFATE HFA 108 (90 BASE) MCG/ACT IN AERS: INHALATION_SPRAY | RESPIRATORY_TRACT | 2 refills | Status: DC

## 2018-12-26 NOTE — Patient Instructions (Addendum)
Will notify you  of labs when available.   Continue attention  tolifestyle intervention healthy eating and activity . Can do upper body weight to help even if knee is problematic  swimming is also a good activity .    Preventive Care 35-58 Years Old, Female Preventive care refers to visits with your health care provider and lifestyle choices that can promote health and wellness. This includes:  A yearly physical exam. This may also be called an annual well check.  Regular dental visits and eye exams.  Immunizations.  Screening for certain conditions.  Healthy lifestyle choices, such as eating a healthy diet, getting regular exercise, not using drugs or products that contain nicotine and tobacco, and limiting alcohol use. What can I expect for my preventive care visit? Physical exam Your health care provider will check your:  Height and weight. This may be used to calculate body mass index (BMI), which tells if you are at a healthy weight.  Heart rate and blood pressure.  Skin for abnormal spots. Counseling Your health care provider may ask you questions about your:  Alcohol, tobacco, and drug use.  Emotional well-being.  Home and relationship well-being.  Sexual activity.  Eating habits.  Work and work Statistician.  Method of birth control.  Menstrual cycle.  Pregnancy history. What immunizations do I need?  Influenza (flu) vaccine  This is recommended every year. Tetanus, diphtheria, and pertussis (Tdap) vaccine  You may need a Td booster every 10 years. Varicella (chickenpox) vaccine  You may need this if you have not been vaccinated. Zoster (shingles) vaccine  You may need this after age 52. Measles, mumps, and rubella (MMR) vaccine  You may need at least one dose of MMR if you were born in 1957 or later. You may also need a second dose. Pneumococcal conjugate (PCV13) vaccine  You may need this if you have certain conditions and were not previously  vaccinated. Pneumococcal polysaccharide (PPSV23) vaccine  You may need one or two doses if you smoke cigarettes or if you have certain conditions. Meningococcal conjugate (MenACWY) vaccine  You may need this if you have certain conditions. Hepatitis A vaccine  You may need this if you have certain conditions or if you travel or work in places where you may be exposed to hepatitis A. Hepatitis B vaccine  You may need this if you have certain conditions or if you travel or work in places where you may be exposed to hepatitis B. Haemophilus influenzae type b (Hib) vaccine  You may need this if you have certain conditions. Human papillomavirus (HPV) vaccine  If recommended by your health care provider, you may need three doses over 6 months. You may receive vaccines as individual doses or as more than one vaccine together in one shot (combination vaccines). Talk with your health care provider about the risks and benefits of combination vaccines. What tests do I need? Blood tests  Lipid and cholesterol levels. These may be checked every 5 years, or more frequently if you are over 5 years old.  Hepatitis C test.  Hepatitis B test. Screening  Lung cancer screening. You may have this screening every year starting at age 62 if you have a 30-pack-year history of smoking and currently smoke or have quit within the past 15 years.  Colorectal cancer screening. All adults should have this screening starting at age 21 and continuing until age 8. Your health care provider may recommend screening at age 12 if you are at increased  risk. You will have tests every 1-10 years, depending on your results and the type of screening test.  Diabetes screening. This is done by checking your blood sugar (glucose) after you have not eaten for a while (fasting). You may have this done every 1-3 years.  Mammogram. This may be done every 1-2 years. Talk with your health care provider about when you should start  having regular mammograms. This may depend on whether you have a family history of breast cancer.  BRCA-related cancer screening. This may be done if you have a family history of breast, ovarian, tubal, or peritoneal cancers.  Pelvic exam and Pap test. This may be done every 3 years starting at age 21. Starting at age 53, this may be done every 5 years if you have a Pap test in combination with an HPV test. Other tests  Sexually transmitted disease (STD) testing.  Bone density scan. This is done to screen for osteoporosis. You may have this scan if you are at high risk for osteoporosis. Follow these instructions at home: Eating and drinking  Eat a diet that includes fresh fruits and vegetables, whole grains, lean protein, and low-fat dairy.  Take vitamin and mineral supplements as recommended by your health care provider.  Do not drink alcohol if: ? Your health care provider tells you not to drink. ? You are pregnant, may be pregnant, or are planning to become pregnant.  If you drink alcohol: ? Limit how much you have to 0-1 drink a day. ? Be aware of how much alcohol is in your drink. In the [place] one drink equals one 12 oz bottle of beer (355 mL), one 5 oz glass of wine (148 mL), or one 1 oz glass of hard liquor (44 mL). Lifestyle  Take daily care of your teeth and gums.  Stay active. Exercise for at least 30 minutes on 5 or more days each week.  Do not use any products that contain nicotine or tobacco, such as cigarettes, e-cigarettes, and chewing tobacco. If you need help quitting, ask your health care provider.  If you are sexually active, practice safe sex. Use a condom or other form of birth control (contraception) in order to prevent pregnancy and STIs (sexually transmitted infections).  If told by your health care provider, take low-dose aspirin daily starting at age 25. What's next?  Visit your health care provider once a year for a well check visit.  Ask your health  care provider how often you should have your eyes and teeth checked.  Stay up to date on all vaccines. This information is not intended to replace advice given to you by your health care provider. Make sure you discuss any questions you have with your health care provider. Document Released: 07/05/2015 Document Revised: 02/17/2018 Document Reviewed: 02/17/2018 Elsevier Patient Education  2020 [employer].

## 2018-12-27 LAB — HIV ANTIBODY (ROUTINE TESTING W REFLEX): HIV 1&2 Ab, 4th Generation: NONREACTIVE

## 2019-01-30 ENCOUNTER — Telehealth: Payer: Self-pay | Admitting: Internal Medicine

## 2019-01-30 NOTE — Telephone Encounter (Signed)
Pt came in and dropped off Medical Evaluation [place]  Division of Social Services Form to be completed by the provider.  Upon completion pt would like for the form to be sent to her at [address], [place], [place] [address].  Form was placed in providers folder.

## 2019-02-01 NOTE — Telephone Encounter (Signed)
Form placed in red folder  

## 2019-02-01 NOTE — Telephone Encounter (Signed)
Form completed and signed

## 2019-02-03 NOTE — Telephone Encounter (Signed)
Mailed out to patient 

## 2019-02-22 ENCOUNTER — Telehealth: Payer: Self-pay | Admitting: Internal Medicine

## 2019-02-22 NOTE — Telephone Encounter (Signed)
Gave fax to [facility] to complete and fax back

## 2019-04-03 ENCOUNTER — Encounter (INDEPENDENT_AMBULATORY_CARE_PROVIDER_SITE_OTHER): Payer: Self-pay

## 2019-04-03 ENCOUNTER — Telehealth: Payer: 59 | Admitting: Physician Assistant

## 2019-04-03 DIAGNOSIS — R05 Cough: Secondary | ICD-10-CM

## 2019-04-03 DIAGNOSIS — Z20828 Contact with and (suspected) exposure to other viral communicable diseases: Secondary | ICD-10-CM

## 2019-04-03 DIAGNOSIS — R059 Cough, unspecified: Secondary | ICD-10-CM

## 2019-04-03 DIAGNOSIS — Z20822 Contact with and (suspected) exposure to covid-19: Secondary | ICD-10-CM

## 2019-04-03 MED ORDER — BENZONATATE 100 MG PO CAPS: 100.0000 mg | ORAL_CAPSULE | Freq: Three times a day (TID) | ORAL | 0 refills | Status: DC | PRN

## 2019-04-03 MED ORDER — PROMETHAZINE-DM 6.25-15 MG/5ML PO SYRP: 5.0000 mL | ORAL_SOLUTION | Freq: Four times a day (QID) | ORAL | 0 refills | Status: DC | PRN

## 2019-04-03 NOTE — Progress Notes (Signed)
E-Visit for Corona Virus Screening   Your current symptoms could be consistent with the coronavirus.  Many health care providers can now test patients at their office but not all are.  [facility] has multiple testing sites. For information on our COVID testing locations and hours go to [email or web address]  Please quarantine yourself while awaiting your test results.  We are enrolling you in our [facility] for [place] . Daily you will receive a questionnaire within the [facility] website. Our COVID 19 response team willl be monitoriing your responses daily.    COVID-19 is a respiratory illness with symptoms that are similar to the flu. Symptoms are typically mild to moderate, but there have been cases of severe illness and death due to the virus. The following symptoms may appear 2-14 days after exposure: . Fever . Cough . Shortness of breath or difficulty breathing . Chills . Repeated shaking with chills . Muscle pain . Headache . Sore throat . New loss of taste or smell . Fatigue . Congestion or runny nose . Nausea or vomiting . Diarrhea  It is vitally important that if you feel that you have an infection such as this virus or any other virus that you stay home and away from places where you may spread it to others.  You should self-quarantine for 14 days if you have symptoms that could potentially be coronavirus or have been in close contact a with a person diagnosed with COVID-19 within the last 2 weeks. You should avoid contact with people age 59 and older.   You should wear a mask or cloth face covering over your nose and mouth if you must be around other people or animals, including pets (even at home). Try to stay at least 6 feet away from other people. This will protect the people around you.  You can use medication such as A prescription cough medication called Tessalon Perles 100 mg. You may take 1-2 capsules every 8 hours as needed for cough  and A prescription cough medication called Phenergan DM 6.25 mg/15 mg. You make take one teaspoon / 5 ml every 4-6 hours as needed for cough  You may also take acetaminophen (Tylenol) as needed for fever.   Reduce your risk of any infection by using the same precautions used for avoiding the common cold or flu:  Marland Kitchen Wash your hands often with soap and warm water for at least 20 seconds.  If soap and water are not readily available, use an alcohol-based hand sanitizer with at least 60% alcohol.  . If coughing or sneezing, cover your mouth and nose by coughing or sneezing into the elbow areas of your shirt or coat, into a tissue or into your sleeve (not your hands). . Avoid shaking hands with others and consider head nods or verbal greetings only. . Avoid touching your eyes, nose, or mouth with unwashed hands.  . Avoid close contact with people who are sick. . Avoid places or events with large numbers of people in one location, like concerts or sporting events. . Carefully consider travel plans you have or are making. . If you are planning any travel outside or inside the [place], visit the CDC's Travelers' Health webpage for the latest health notices. . If you have some symptoms but not all symptoms, continue to monitor at home and seek medical attention if your symptoms worsen. . If you are having a medical emergency, call 911.  HOME CARE . Only take medications as instructed by your  medical team. . Drink plenty of fluids and get plenty of rest. . A steam or ultrasonic humidifier can help if you have congestion.   GET HELP RIGHT AWAY IF YOU HAVE EMERGENCY WARNING SIGNS** FOR COVID-19. If you or someone is showing any of these signs seek emergency medical care immediately. Call 911 or proceed to your closest emergency facility if: . You develop worsening high fever. . Trouble breathing . Bluish lips or face . Persistent pain or pressure in the chest . New confusion . Inability to wake or stay  awake . You cough up blood. . Your symptoms become more severe  **This list is not all possible symptoms. Contact your medical provider for any symptoms that are sever or concerning to you.   MAKE SURE YOU   Understand these instructions.  Will watch your condition.  Will get help right away if you are not doing well or get worse.  Your e-visit answers were reviewed by a board certified advanced clinical practitioner to complete your personal care plan.  Depending on the condition, your plan could have included both over the counter or prescription medications.  If there is a problem please reply once you have received a response from your provider.  Your safety is important to [place].  If you have drug allergies check your prescription carefully.    You can use MyChart to ask questions about today's visit, request a non-urgent call back, or ask for a work or school excuse for 24 hours related to this e-Visit. If it has been greater than 24 hours you will need to follow up with your provider, or enter a new e-Visit to address those concerns. You will get an e-mail in the next two days asking about your experience.  I hope that your e-visit has been valuable and will speed your recovery. Thank you for using e-visits.   Greater than 5 minutes, yet less than 10 minutes of time have been spent researching, coordinating and implementing care for this patient today.

## 2019-06-30 ENCOUNTER — Ambulatory Visit: Payer: 59 | Admitting: Family Medicine

## 2019-06-30 ENCOUNTER — Other Ambulatory Visit: Payer: Self-pay

## 2019-06-30 ENCOUNTER — Encounter: Payer: Self-pay | Admitting: Family Medicine

## 2019-06-30 VITALS — BP 128/80 | HR 107 | Temp 96.0°F | Ht 67.0 in | Wt 218.0 lb

## 2019-06-30 DIAGNOSIS — L03011 Cellulitis of right finger: Secondary | ICD-10-CM | POA: Diagnosis not present

## 2019-06-30 MED ORDER — SULFAMETHOXAZOLE-TRIMETHOPRIM 800-160 MG PO TABS: 1.0000 | ORAL_TABLET | Freq: Two times a day (BID) | ORAL | 0 refills | Status: AC

## 2019-06-30 NOTE — Patient Instructions (Signed)
A few things to remember from today's visit:   Paronychia of finger of right hand - Plan: sulfamethoxazole-trimethoprim (BACTRIM DS) 800-160 MG tablet  Soak finger with warm water and epson salt 2-3 times per day.  Paronychia Paronychia is an infection of the skin. It happens near a fingernail or toenail. It may cause pain and swelling around the nail. In some cases, a fluid-filled bump (abscess) can form near or under the nail. Usually, this condition is not serious, and it clears up with treatment. Follow these instructions at home: Wound care  Keep the affected area clean.  Soak the fingers or toes in warm water as told by your doctor. You may be told to do this for 20 minutes, 2-3 times a day.  Keep the area dry when you are not soaking it.  Do not try to drain a fluid-filled bump on your own.  Follow instructions from your doctor about how to take care of the affected area. Make sure you: ? Wash your hands with soap and water before you change your bandage (dressing). If you cannot use soap and water, use hand sanitizer. ? Change your bandage as told by your doctor.  If you had a fluid-filled bump and your doctor drained it, check the area every day for signs of infection. Check for: ? Redness, swelling, or pain. ? Fluid or blood. ? Warmth. ? Pus or a bad smell. Medicines   Take over-the-counter and prescription medicines only as told by your doctor.  If you were prescribed an antibiotic medicine, take it as told by your doctor. Do not stop taking it even if you start to feel better. General instructions  Avoid touching any chemicals.  Do not pick at the affected area. Prevention  To prevent this condition from happening again: ? Wear rubber gloves when putting your hands in water for washing dishes or other tasks. ? Wear gloves if your hands might touch cleaners or chemicals. ? Avoid injuring your nails or fingertips. ? Do not bite your nails or tear  hangnails. ? Do not cut your nails very short. ? Do not cut the skin at the base and sides of the nail (cuticles). ? Use clean nail clippers or scissors when trimming nails. Contact a doctor if:  You feel worse.  You do not get better.  You have more fluid, blood, or pus coming from the affected area.  Your finger or knuckle is swollen or is hard to move. Get help right away if you have:  A fever or chills.  Redness spreading from the affected area.  Pain in a joint or muscle. Summary  Paronychia is an infection of the skin. It happens near a fingernail or toenail.  This condition may cause pain and swelling around the nail.  Soak the fingers or toes in warm water as told by your doctor.  Usually, this condition is not serious, and it clears up with treatment. This information is not intended to replace advice given to you by your health care provider. Make sure you discuss any questions you have with your health care provider. Document Revised: 06/25/2017 Document Reviewed: 06/21/2017 Elsevier Patient Education  2020 [place] be sure medication list is accurate. If a new problem present, please set up appointment sooner than planned today.

## 2019-06-30 NOTE — Progress Notes (Signed)
ACUTE VISIT   HPI:  Chief Complaint  Patient presents with  . ingrown nail    [name] is a 59 y.o. female, who is here today complaining of right index periungual edema and erythema that started about 4 days ago,getting worse. Negative for fever,chills,artrhalgias,numbness,or limitation of ROM. Exacerbated by touching area.  No drainage. Soaking finger a few times. Constant.throbbing pain, 6/10.  No similar problem in the past. Right handed.  Review of Systems  Constitutional: Negative for activity change and appetite change.  Musculoskeletal: Negative for gait problem and myalgias.  Neurological: Negative for weakness and numbness.  Rest see pertinent positives and negatives per HPI.   Current Outpatient Medications on File Prior to Visit  Medication Sig Dispense Refill  . ADDERALL XR 30 MG 24 hr capsule Take by mouth daily.    Marland Kitchen albuterol (VENTOLIN HFA) 108 (90 Base) MCG/ACT inhaler USE 2 PUFFS EVERY 4 TO 6 HOURS AS NEEDED FOR SHORTNESS OF BREATH 18 g 2  . benzonatate (TESSALON) 100 MG capsule Take 1-2 capsules (100-200 mg total) by mouth 3 (three) times daily as needed for cough. 40 capsule 0  . Biotin 5 MG TABS Take by mouth.      Marland Kitchen CALCIUM PO Take by mouth.    . Cholecalciferol (VITAMIN D PO) Take by mouth daily.     . fish oil-omega-3 fatty acids 1000 MG capsule Take 2 g by mouth daily.      Marland Kitchen MAGNESIUM PO Take 600 mg by mouth daily.    . minoxidil (ROGAINE) 2 % external solution Apply topically 2 (two) times daily.    . MULTIPLE VITAMIN PO Take by mouth.      . Probiotic Product (PROBIOTIC FORMULA PO) Take by mouth.      . promethazine-dextromethorphan (PROMETHAZINE-DM) 6.25-15 MG/5ML syrup Take 5 mLs by mouth 4 (four) times daily as needed for cough. 118 mL 0  . raloxifene (EVISTA) 60 MG tablet Take 60 mg by mouth daily.    . Tafluprost (ZIOPTAN) 0.0015 % SOLN Apply 1 drop to eye daily.     No current facility-administered medications on  file prior to visit.     Past Medical History:  Diagnosis Date  . Asthma   . Chest pain, atypical   . CHEST PAIN-PRECORDIAL 10/26/2008   Qualifier: Diagnosis of  By: Burt Knack, MD, Clayburn Pert   . Chronic constipation   . Glaucoma   . H/O: hysterectomy 2011   presented with irregular bleeding  . ICP (infantile cerebral palsy) (HCC)    elevated  . Iron deficiency anemia   . MENOPAUSE, SURGICAL 01/06/2010   Qualifier: Diagnosis of  By: Regis Bill MD, Standley Brooking   . NEOPLASM, MALIGNANT, UTERUS, HX OF 01/06/2010   Qualifier: Diagnosis of  By: Regis Bill MD, Standley Brooking   . Nevus, atypical   . Osteochondroma   . OSTEOCHONDROMA 10/10/2008   Qualifier: Diagnosis of  By: Ronnald Ramp CMA, [place]    . Rhinitis   . Uterine cancer ([facility])    No Known Allergies  Social History   Socioeconomic History  . Marital status: Married    Spouse name: Not on file  . Number of children: Not on file  . Years of education: Not on file  . Highest education level: Not on file  Occupational History  . Not on file  Tobacco Use  . Smoking status: Never Smoker  . Smokeless tobacco: Never Used  Substance and Sexual Activity  . Alcohol use: Yes  Alcohol/week: 0.0 standard drinks    Comment: socially  . Drug use: No  . Sexual activity: Not on file  Other Topics Concern  . 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    She does not smoke cigarettes or use recreational drugs.     She drinks alcohol on rare occasions with 1 glass of wine. Approx. Once a month.  She does not do regular exercise.   recently    HH of 3 -4hysband and 51 yo son  Has exchange student from [place] now    Pet poodle   Husband is a smoker   [name] is married with 3 children, ages  One fr college another on her own       hh of 4    Social Determinants of Systems developer Strain:   . Difficulty of Paying Living Expenses: Not on file  Food Insecurity:   . Worried About Charity fundraiser in the Last Year: Not on file  . Ran Out of Food in the Last Year: Not on file  Transportation Needs:   . Lack of Transportation (Medical): Not on file  . Lack of Transportation (Non-Medical): Not on file  Physical Activity:   . Days of Exercise per Week: Not on file  . Minutes of Exercise per Session: Not on file  Stress:   . Feeling of Stress : Not on file  Social Connections:   . Frequency of Communication with Friends and Family: Not on file  . Frequency of Social Gatherings with Friends and Family: Not on file  . Attends Religious Services: Not on file  . Active Member of Clubs or Organizations: Not on file  . Attends Archivist Meetings: Not on file  . Marital Status: Not on file    Vitals:   06/30/19 1207  BP: 128/80  Pulse: (!) 107  Temp: (!) 96 F (35.6 C)  SpO2: 97%   Body mass index is 34.14 kg/m.   Physical Exam  Nursing note and vitals reviewed. Constitutional: She is oriented to person, place, and time. She appears well-developed. No distress.  HENT:  Head: Normocephalic and atraumatic.  Mouth/Throat: Oropharynx is clear and moist and mucous membranes are normal.  Eyes: Conjunctivae are normal.  Cardiovascular: Normal rate and regular rhythm.  Pulses:      Radial pulses are 2+ on the right side.  Respiratory: Effort normal. No respiratory distress.  Musculoskeletal:        General: No edema.     Right hand: Swelling and tenderness present. Normal range of motion. Normal sensation. Normal capillary refill.       Hands:  Neurological: She is alert and oriented to person, place, and time.  Skin: Skin is warm. No rash noted. There is erythema.  Right index: Periungual edema and erythema,ulnar aspect. Tender upon light touch.1 mm clear crust , not active drainage.  Psychiatric: She has a normal mood and affect.  Well  groomed, good eye contact.    ASSESSMENT AND PLAN:  [name] [name] was seen today for ingrown nail.  Diagnoses and all orders for this visit:  Paronychia of finger of right hand -     sulfamethoxazole-trimethoprim (BACTRIM DS) 800-160 MG tablet; Take 1 tablet by mouth 2 (  two) times daily for 7 days.   Continue soaking finger with warm water and Epson salt 2-3 times per day. Massage cuticle away from finger nail. Some side effects of abx discusses.  Instructed about warning signs.  Return if symptoms worsen or fail to improve.    Ronav Furney G. [place], MD  [facility]. [place] office.

## 2019-12-26 NOTE — Progress Notes (Signed)
Chief Complaint  Patient presents with  . Annual Exam    Doing okay    HPI: Patient  Hannah [name]  59 y.o. 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 COVID-19 Vaccine (1) Never done  . PAP SMEAR-Modifier  03/23/2019  . INFLUENZA VACCINE  01/21/2020  . COLONOSCOPY  02/23/2020  . MAMMOGRAM  07/09/2020  . TETANUS/TDAP  08/10/2024  . Hepatitis C Screening  Completed  . HIV Screening  Completed  covid vaccine    A few months ago .  Health Maintenance Review LIFESTYLE:  Exercise:   Walk  Active .  Tobacco/ETS: no Alcohol: soda ginger ale  Sugar beverages:  Sleep:  About  7+ hours  Drug use: no HH of  5  Foster age 53 and 3   1 dog  Work: full time   Work and they are in day care .     ROS:  GEN/ HEENT: No fever, significant weight changes sweats headaches vision problems hearing changes, CV/ PULM; No chest pain shortness of breath cough, syncope,edema  change in exercise tolerance. GI /GU: No adominal pain, vomiting, change in bowel habits. No blood in the stool. No significant GU symptoms. SKIN/HEME: ,no acute skin rashes suspicious lesions or bleeding. No lymphadenopathy, nodules, masses.  NEURO/ PSYCH:  No neurologic signs such as weakness numbness. No depression anxiety. IMM/ Allergy: No unusual infections.  Allergy .   REST of 12 system review negative except as per HPI   Past Medical History:  Diagnosis Date  . Asthma   . Chest pain, atypical   . CHEST PAIN-PRECORDIAL 10/26/2008   Qualifier: Diagnosis of  By: Burt Knack, MD, Clayburn Pert   . Chronic constipation   .  Glaucoma   . H/O: hysterectomy 2011   presented with irregular bleeding  . ICP (infantile cerebral palsy) (HCC)    elevated  . Iron deficiency anemia   . MENOPAUSE, SURGICAL 01/06/2010   Qualifier: Diagnosis of  By: Regis Bill MD, Standley Brooking   . NEOPLASM, MALIGNANT, UTERUS, HX OF 01/06/2010   Qualifier: Diagnosis of  By: Regis Bill MD, Standley Brooking   . Nevus, atypical   . Osteochondroma   . OSTEOCHONDROMA 10/10/2008   Qualifier: Diagnosis of  By: Ronnald Ramp CMA, Chemira    . Rhinitis   . Uterine cancer [facility])     Past Surgical History:  Procedure Laterality Date  . CESAREAN SECTION    . FOOT SURGERY    . MENISCUS REPAIR  01/2019   Left side  . MYOMECTOMY    . TOTAL VAGINAL HYSTERECTOMY  2011  . TUBAL LIGATION    . UMBILICAL HERNIA REPAIR      Family History  Problem Relation Age of Onset  . Hypertension Mother        deceased  . Alcohol abuse Mother   . Hypertension Sister   . Diabetes Father        died from complications of dm, amputation and sepsis in his 66's   .  Colon polyps Other   . Breast cancer Maternal Aunt     Social History   Socioeconomic History  . Marital status: Married    Spouse name: Not on file  . Number of children: Not on file  . Years of education: Not on file  . Highest education level: Not on file  Occupational History  . Not on file  Tobacco Use  . Smoking status: Never Smoker  . Smokeless tobacco: Never Used  Vaping Use  . Vaping Use: Never used  Substance and Sexual Activity  . Alcohol use: Yes    Alcohol/week: 0.0 standard drinks    Comment: socially  . Drug use: No  . Sexual activity: Not on file  Other Topics Concern  . 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    She does not smoke cigarettes or use recreational drugs.     She drinks alcohol on rare occasions with 1 glass of wine. Approx.  Once a month.  She does not do regular exercise.   recently    HH of 3 -4hysband and 40 yo son  Has exchange student from [place] now    Pet poodle   Husband is a smoker   [name] is married with 3 children, ages  One fr college another on her own       hh of 4    Social Determinants of Radio broadcast assistant Strain:   . Difficulty of Paying Living Expenses:   Food Insecurity:   . Worried About Charity fundraiser in the Last Year:   . Arboriculturist in the Last Year:   Transportation Needs:   . Film/video editor (Medical):   Marland Kitchen Lack of Transportation (Non-Medical):   Physical Activity:   . Days of Exercise per Week:   . Minutes of Exercise per Session:   Stress:   . Feeling of Stress :   Social Connections:   . Frequency of Communication with Friends and Family:   . Frequency of Social Gatherings with Friends and Family:   . Attends Religious Services:   . Active Member of Clubs or Organizations:   . Attends Archivist Meetings:   Marland Kitchen Marital Status:     Outpatient Medications Prior to Visit  Medication Sig Dispense Refill  . ADDERALL XR 30 MG 24 hr capsule Take by mouth daily.    Marland Kitchen albuterol (VENTOLIN HFA) 108 (90 Base) MCG/ACT inhaler USE 2 PUFFS EVERY 4 TO 6 HOURS AS NEEDED FOR SHORTNESS OF BREATH 18 g 2  . Biotin 5 MG TABS Take by mouth.      Marland Kitchen CALCIUM PO Take by mouth.    . Cholecalciferol (VITAMIN D PO) Take by mouth daily.     . CONCERTA 18 MG CR tablet Take 18 mg by mouth daily.    . fish oil-omega-3 fatty acids 1000 MG capsule Take 2 g by mouth daily.      Marland Kitchen MAGNESIUM PO Take 600 mg by mouth daily.    . MULTIPLE VITAMIN PO Take by mouth.      . Probiotic Product (PROBIOTIC FORMULA PO) Take by mouth.      . raloxifene (EVISTA) 60 MG tablet Take 60 mg by mouth daily.    Marland Kitchen  Tafluprost (ZIOPTAN) 0.0015 % SOLN Apply 1 drop to eye daily.    . benzonatate (TESSALON) 100 MG capsule Take 1-2 capsules (100-200 mg total) by mouth 3 (three)  times daily as needed for cough. 40 capsule 0  . minoxidil (ROGAINE) 2 % external solution Apply topically 2 (two) times daily.    . promethazine-dextromethorphan (PROMETHAZINE-DM) 6.25-15 MG/5ML syrup Take 5 mLs by mouth 4 (four) times daily as needed for cough. 118 mL 0   No facility-administered medications prior to visit.     EXAM:  BP 120/74   Pulse 75   Temp 97.7 F (36.5 C) (Temporal)   Ht 5' 7.5" (1.715 m)   Wt 211 lb 6.4 oz (95.9 kg)   SpO2 97%   BMI 32.62 kg/m   Body mass index is 32.62 kg/m. Wt Readings from Last 3 Encounters:  12/27/19 211 lb 6.4 oz (95.9 kg)  06/30/19 218 lb (98.9 kg)  12/26/18 207 lb (93.9 kg)    Physical Exam: Vital signs reviewed QHU:TMLY is a well-developed well-nourished alert cooperative    who appearsr stated age in no acute distress.  HEENT: normocephalic atraumatic , Eyes: PERRL EOM's full, conjunctiva clear, Nares: paten,t no deformity discharge or tenderness., Ears: no deformity EAC's clear TMs with normal landmarks. Mouth: masked  NECK: supple without masses, thyromegaly or bruits. CHEST/PULM:  Clear to auscultation and percussion breath sounds equal no wheeze , rales or rhonchi. No chest wall deformities or tenderness. Breast: normal by inspection . No dimpling, discharge, masses, tenderness or discharge . CV: PMI is nondisplaced, S1 S2 no gallops, murmurs, rubs. Peripheral pulses are full without delay.No JVD .  ABDOMEN: Bowel sounds normal nontender  No guard or rebound, no hepato splenomegal no CVA tenderness.   Extremtities:  No clubbing cyanosis or edema, no acute joint swelling or redness no focal atrophy NEURO:  Oriented x3, cranial nerves 3-12 appear to be intact, no obvious focal weakness,gait within normal limits no abnormal reflexes or asymmetrical SKIN: No acute rashes normal turgor, color, no bruising or petechiae. No obv susp lesions  PSYCH: Oriented, good eye contact, no obvious depression anxiety, cognition and  judgment appear normal. LN: no cervical axillary inguinal adenopathy  Lab Results  Component Value Date   WBC 6.7 12/27/2019   HGB 13.9 12/27/2019   HCT 41.7 12/27/2019   PLT 284.0 12/27/2019   GLUCOSE 103 (H) 12/27/2019   CHOL 185 12/27/2019   TRIG 83.0 12/27/2019   HDL 44.50 12/27/2019   LDLDIRECT 137.9 10/10/2008   LDLCALC 124 (H) 12/27/2019   ALT 11 12/27/2019   AST 13 12/27/2019   NA 139 12/27/2019   K 3.9 12/27/2019   CL 105 12/27/2019   CREATININE 1.03 12/27/2019   BUN 13 12/27/2019   CO2 28 12/27/2019   TSH 1.25 12/27/2019   HGBA1C 6.0 12/27/2019    BP Readings from Last 3 Encounters:  12/27/19 120/74  06/30/19 128/80  12/26/18 122/68    Labplan  reviewed with patient   ASSESSMENT AND PLAN:  Discussed the following assessment and plan:    ICD-10-CM   1. Visit for preventive health examination  Z00.00 Hemoglobin A1c    T4, free    TSH    Lipid panel    Hepatic function panel    CBC with Differential/Platelet    Basic metabolic panel    CANCELED: Basic metabolic panel    CANCELED: CBC with Differential/Platelet    CANCELED: Hepatic function panel    CANCELED: Lipid panel  CANCELED: TSH    CANCELED: T4, free    CANCELED: Hemoglobin A1c  2. Hair loss  L65.9 Hemoglobin A1c    T4, free    TSH    Lipid panel    Hepatic function panel    CBC with Differential/Platelet    Basic metabolic panel    CANCELED: Basic metabolic panel    CANCELED: CBC with Differential/Platelet    CANCELED: Hepatic function panel    CANCELED: Lipid panel    CANCELED: TSH    CANCELED: T4, free    CANCELED: Hemoglobin A1c  3. Hyperglycemia  R73.9 Hemoglobin A1c    T4, free    TSH    Lipid panel    Hepatic function panel    CBC with Differential/Platelet    Basic metabolic panel    CANCELED: Basic metabolic panel    CANCELED: CBC with Differential/Platelet    CANCELED: Hepatic function panel    CANCELED: Lipid panel    CANCELED: TSH    CANCELED: T4, free     CANCELED: Hemoglobin A1c  4. Encounter for screening for malignant neoplasm of colon  Z12.11 Cologuard    Update labs  [place] for cologuard  Ok to get [place] form for fostercare parenting  Yearly visit if all ok  .  Continue  attention lifestyle intervention healthy eating and exercise .  To get weight below bmi 30 Return in about 1 year (around 12/26/2020) for cpx with labs.  Patient Care Team: Charley Lafrance, Standley Brooking, MD as PCP - General Servando Salina, MD (Obstetrics and Gynecology) Sherren Mocha, MD (Cardiology) Gaynelle Arabian, MD as Consulting Physician (Orthopedic Surgery) Patient Instructions  Exam is good today  bp goal 120/80     Healthy weight control will help  Prevent diabetes and  Other conditions   bmi goal below 30    Below 27     Will notify you  of labs when available.   Will order cologuard    Health Maintenance, Female Adopting a healthy lifestyle and getting preventive care are important in promoting health and wellness. Ask your health care provider about:  The right schedule for you to have regular tests and exams.  Things you can do on your own to prevent diseases and keep yourself healthy. What should I know about diet, weight, and exercise? Eat a healthy diet   Eat a diet that includes plenty of vegetables, fruits, low-fat dairy products, and lean protein.  Do not eat a lot of foods that are high in solid fats, added sugars, or sodium. Maintain a healthy weight Body mass index (BMI) is used to identify weight problems. It estimates body fat based on height and weight. Your health care provider can help determine your BMI and help you achieve or maintain a healthy weight. Get regular exercise Get regular exercise. This is one of the most important things you can do for your health. Most adults should:  Exercise for at least 150 minutes each week. The exercise should increase your heart rate and make you sweat (moderate-intensity exercise).  Do  strengthening exercises at least twice a week. This is in addition to the moderate-intensity exercise.  Spend less time sitting. Even light physical activity can be beneficial. Watch cholesterol and blood lipids Have your blood tested for lipids and cholesterol at 59 years of age, then have this test every 5 years. Have your cholesterol levels checked more often if:  Your lipid or cholesterol levels are high.  You are older than 60  years of age.  You are at high risk for heart disease. What should I know about cancer screening? Depending on your health history and family history, you may need to have cancer screening at various ages. This may include screening for:  Breast cancer.  Cervical cancer.  Colorectal cancer.  Skin cancer.  Lung cancer. What should I know about heart disease, diabetes, and high blood pressure? Blood pressure and heart disease  High blood pressure causes heart disease and increases the risk of stroke. This is more likely to develop in people who have high blood pressure readings, are of African descent, or are overweight.  Have your blood pressure checked: ? Every 3-5 years if you are 32-65 years of age. ? Every year if you are 30 years old or older. Diabetes Have regular diabetes screenings. This checks your fasting blood sugar level. Have the screening done:  Once every three years after age 14 if you are at a normal weight and have a low risk for diabetes.  More often and at a younger age if you are overweight or have a high risk for diabetes. What should I know about preventing infection? Hepatitis B If you have a higher risk for hepatitis B, you should be screened for this virus. Talk with your health care provider to find out if you are at risk for hepatitis B infection. Hepatitis C Testing is recommended for:  Everyone born from [birth year] through 1965.  Anyone with known risk factors for hepatitis C. Sexually transmitted infections  (STIs)  Get screened for STIs, including gonorrhea and chlamydia, if: ? You are sexually active and are younger than 59 years of age. ? You are older than 59 years of age and your health care provider tells you that you are at risk for this type of infection. ? Your sexual activity has changed since you were last screened, and you are at increased risk for chlamydia or gonorrhea. Ask your health care provider if you are at risk.  Ask your health care provider about whether you are at high risk for HIV. Your health care provider may recommend a prescription medicine to help prevent HIV infection. If you choose to take medicine to prevent HIV, you should first get tested for HIV. You should then be tested every 3 months for as long as you are taking the medicine. Pregnancy  If you are about to stop having your period (premenopausal) and you may become pregnant, seek counseling before you get pregnant.  Take 400 to 800 micrograms (mcg) of folic acid every day if you become pregnant.  Ask for birth control (contraception) if you want to prevent pregnancy. Osteoporosis and menopause Osteoporosis is a disease in which the bones lose minerals and strength with aging. This can result in bone fractures. If you are 89 years old or older, or if you are at risk for osteoporosis and fractures, ask your health care provider if you should:  Be screened for bone loss.  Take a calcium or vitamin D supplement to lower your risk of fractures.  Be given hormone replacement therapy (HRT) to treat symptoms of menopause. Follow these instructions at home: Lifestyle  Do not use any products that contain nicotine or tobacco, such as cigarettes, e-cigarettes, and chewing tobacco. If you need help quitting, ask your health care provider.  Do not use street drugs.  Do not share needles.  Ask your health care provider for help if you need support or information about quitting drugs. Alcohol  use  Do not drink  alcohol if: ? Your health care provider tells you not to drink. ? You are pregnant, may be pregnant, or are planning to become pregnant.  If you drink alcohol: ? Limit how much you use to 0-1 drink a day. ? Limit intake if you are breastfeeding.  Be aware of how much alcohol is in your drink. In the [place] one drink equals one 12 oz bottle of beer (355 mL), one 5 oz glass of wine (148 mL), or one 1 oz glass of hard liquor (44 mL). General instructions  Schedule regular health, dental, and eye exams.  Stay current with your vaccines.  Tell your health care provider if: ? You often feel depressed. ? You have ever been abused or do not feel safe at home. Summary  Adopting a healthy lifestyle and getting preventive care are important in promoting health and wellness.  Follow your health care provider's instructions about healthy diet, exercising, and getting tested or screened for diseases.  Follow your health care provider's instructions on monitoring your cholesterol and blood pressure. This information is not intended to replace advice given to you by your health care provider. Make sure you discuss any questions you have with your health care provider. Document Revised: 06/01/2018 Document Reviewed: 06/01/2018 Elsevier Patient Education  2020 [place] Travaughn Vue M.D.

## 2019-12-27 ENCOUNTER — Encounter: Payer: Self-pay | Admitting: Internal Medicine

## 2019-12-27 ENCOUNTER — Ambulatory Visit (INDEPENDENT_AMBULATORY_CARE_PROVIDER_SITE_OTHER): Payer: 59 | Admitting: Internal Medicine

## 2019-12-27 ENCOUNTER — Other Ambulatory Visit (INDEPENDENT_AMBULATORY_CARE_PROVIDER_SITE_OTHER): Payer: 59

## 2019-12-27 ENCOUNTER — Telehealth: Payer: Self-pay

## 2019-12-27 ENCOUNTER — Other Ambulatory Visit: Payer: Self-pay

## 2019-12-27 VITALS — BP 120/74 | HR 75 | Temp 97.7°F | Ht 67.5 in | Wt 211.4 lb

## 2019-12-27 DIAGNOSIS — L659 Nonscarring hair loss, unspecified: Secondary | ICD-10-CM | POA: Diagnosis not present

## 2019-12-27 DIAGNOSIS — R739 Hyperglycemia, unspecified: Secondary | ICD-10-CM | POA: Diagnosis not present

## 2019-12-27 DIAGNOSIS — Z6832 Body mass index (BMI) 32.0-32.9, adult: Secondary | ICD-10-CM

## 2019-12-27 DIAGNOSIS — Z0000 Encounter for general adult medical examination without abnormal findings: Secondary | ICD-10-CM | POA: Diagnosis not present

## 2019-12-27 DIAGNOSIS — Z1211 Encounter for screening for malignant neoplasm of colon: Secondary | ICD-10-CM | POA: Diagnosis not present

## 2019-12-27 LAB — CBC WITH DIFFERENTIAL/PLATELET
Basophils Absolute: 0.1 10*3/uL (ref 0.0–0.1)
Basophils Relative: 0.8 % (ref 0.0–3.0)
Eosinophils Absolute: 0.1 10*3/uL (ref 0.0–0.7)
Eosinophils Relative: 1.4 % (ref 0.0–5.0)
HCT: 41.7 % (ref 36.0–46.0)
Hemoglobin: 13.9 g/dL (ref 12.0–15.0)
Lymphocytes Relative: 40.1 % (ref 12.0–46.0)
Lymphs Abs: 2.7 10*3/uL (ref 0.7–4.0)
MCHC: 33.2 g/dL (ref 30.0–36.0)
MCV: 85.2 fl (ref 78.0–100.0)
Monocytes Absolute: 0.5 10*3/uL (ref 0.1–1.0)
Monocytes Relative: 7.8 % (ref 3.0–12.0)
Neutro Abs: 3.4 10*3/uL (ref 1.4–7.7)
Neutrophils Relative %: 49.9 % (ref 43.0–77.0)
Platelets: 284 10*3/uL (ref 150.0–400.0)
RBC: 4.89 Mil/uL (ref 3.87–5.11)
RDW: 13.5 % (ref 11.5–15.5)
WBC: 6.7 10*3/uL (ref 4.0–10.5)

## 2019-12-27 LAB — HEMOGLOBIN A1C: Hgb A1c MFr Bld: 6 % (ref 4.6–6.5)

## 2019-12-27 LAB — BASIC METABOLIC PANEL
BUN: 13 mg/dL (ref 6–23)
CO2: 28 mEq/L (ref 19–32)
Calcium: 9.5 mg/dL (ref 8.4–10.5)
Chloride: 105 mEq/L (ref 96–112)
Creatinine, Ser: 1.03 mg/dL (ref 0.40–1.20)
GFR: 66.34 mL/min (ref >60.00)
Glucose, Bld: 103 mg/dL — ABNORMAL HIGH (ref 70–99)
Potassium: 3.9 mEq/L (ref 3.5–5.1)
Sodium: 139 mEq/L (ref 135–145)

## 2019-12-27 LAB — HEPATIC FUNCTION PANEL
ALT: 11 U/L (ref 0–35)
AST: 13 U/L (ref 0–37)
Albumin: 4.4 g/dL (ref 3.5–5.2)
Alkaline Phosphatase: 60 U/L (ref 39–117)
Bilirubin, Direct: 0.1 mg/dL (ref 0.0–0.3)
Total Bilirubin: 0.4 mg/dL (ref 0.2–1.2)
Total Protein: 7.3 g/dL (ref 6.0–8.3)

## 2019-12-27 LAB — LIPID PANEL
Cholesterol: 185 mg/dL (ref 0–200)
HDL: 44.5 mg/dL (ref >39.00)
LDL Cholesterol: 124 mg/dL — ABNORMAL HIGH (ref 0–99)
NonHDL: 140.34
Total CHOL/HDL Ratio: 4
Triglycerides: 83 mg/dL (ref 0.0–149.0)
VLDL: 16.6 mg/dL (ref 0.0–40.0)

## 2019-12-27 LAB — TSH: TSH: 1.25 u[IU]/mL (ref 0.35–4.50)

## 2019-12-27 LAB — T4, FREE: Free T4: 0.74 ng/dL (ref 0.60–1.60)

## 2019-12-27 NOTE — Patient Instructions (Signed)
Exam is good today  bp goal 120/80     Healthy weight control will help  Prevent diabetes and  Other conditions   bmi goal below 30    Below 27     Will notify you  of labs when available.   Will order cologuard    Health Maintenance, Female Adopting a healthy lifestyle and getting preventive care are important in promoting health and wellness. Ask your health care provider about:  The right schedule for you to have regular tests and exams.  Things you can do on your own to prevent diseases and keep yourself healthy. What should I know about diet, weight, and exercise? Eat a healthy diet   Eat a diet that includes plenty of vegetables, fruits, low-fat dairy products, and lean protein.  Do not eat a lot of foods that are high in solid fats, added sugars, or sodium. Maintain a healthy weight Body mass index (BMI) is used to identify weight problems. It estimates body fat based on height and weight. Your health care provider can help determine your BMI and help you achieve or maintain a healthy weight. Get regular exercise Get regular exercise. This is one of the most important things you can do for your health. Most adults should:  Exercise for at least 150 minutes each week. The exercise should increase your heart rate and make you sweat (moderate-intensity exercise).  Do strengthening exercises at least twice a week. This is in addition to the moderate-intensity exercise.  Spend less time sitting. Even light physical activity can be beneficial. Watch cholesterol and blood lipids Have your blood tested for lipids and cholesterol at 59 years of age, then have this test every 5 years. Have your cholesterol levels checked more often if:  Your lipid or cholesterol levels are high.  You are older than 59 years of age.  You are at high risk for heart disease. What should I know about cancer screening? Depending on your health history and family history, you may need to have  cancer screening at various ages. This may include screening for:  Breast cancer.  Cervical cancer.  Colorectal cancer.  Skin cancer.  Lung cancer. What should I know about heart disease, diabetes, and high blood pressure? Blood pressure and heart disease  High blood pressure causes heart disease and increases the risk of stroke. This is more likely to develop in people who have high blood pressure readings, are of African descent, or are overweight.  Have your blood pressure checked: ? Every 3-5 years if you are 86-39 years of age. ? Every year if you are 67 years old or older. Diabetes Have regular diabetes screenings. This checks your fasting blood sugar level. Have the screening done:  Once every three years after age 74 if you are at a normal weight and have a low risk for diabetes.  More often and at a younger age if you are overweight or have a high risk for diabetes. What should I know about preventing infection? Hepatitis B If you have a higher risk for hepatitis B, you should be screened for this virus. Talk with your health care provider to find out if you are at risk for hepatitis B infection. Hepatitis C Testing is recommended for:  Everyone born from [birth year] through 1965.  Anyone with known risk factors for hepatitis C. Sexually transmitted infections (STIs)  Get screened for STIs, including gonorrhea and chlamydia, if: ? You are sexually active and are younger than 59 years  of age. ? You are older than 59 years of age and your health care provider tells you that you are at risk for this type of infection. ? Your sexual activity has changed since you were last screened, and you are at increased risk for chlamydia or gonorrhea. Ask your health care provider if you are at risk.  Ask your health care provider about whether you are at high risk for HIV. Your health care provider may recommend a prescription medicine to help prevent HIV infection. If you choose to take  medicine to prevent HIV, you should first get tested for HIV. You should then be tested every 3 months for as long as you are taking the medicine. Pregnancy  If you are about to stop having your period (premenopausal) and you may become pregnant, seek counseling before you get pregnant.  Take 400 to 800 micrograms (mcg) of folic acid every day if you become pregnant.  Ask for birth control (contraception) if you want to prevent pregnancy. Osteoporosis and menopause Osteoporosis is a disease in which the bones lose minerals and strength with aging. This can result in bone fractures. If you are 51 years old or older, or if you are at risk for osteoporosis and fractures, ask your health care provider if you should:  Be screened for bone loss.  Take a calcium or vitamin D supplement to lower your risk of fractures.  Be given hormone replacement therapy (HRT) to treat symptoms of menopause. Follow these instructions at home: Lifestyle  Do not use any products that contain nicotine or tobacco, such as cigarettes, e-cigarettes, and chewing tobacco. If you need help quitting, ask your health care provider.  Do not use street drugs.  Do not share needles.  Ask your health care provider for help if you need support or information about quitting drugs. Alcohol use  Do not drink alcohol if: ? Your health care provider tells you not to drink. ? You are pregnant, may be pregnant, or are planning to become pregnant.  If you drink alcohol: ? Limit how much you use to 0-1 drink a day. ? Limit intake if you are breastfeeding.  Be aware of how much alcohol is in your drink. In the [place] one drink equals one 12 oz bottle of beer (355 mL), one 5 oz glass of wine (148 mL), or one 1 oz glass of hard liquor (44 mL). General instructions  Schedule regular health, dental, and eye exams.  Stay current with your vaccines.  Tell your health care provider if: ? You often feel depressed. ? You have  ever been abused or do not feel safe at home. Summary  Adopting a healthy lifestyle and getting preventive care are important in promoting health and wellness.  Follow your health care provider's instructions about healthy diet, exercising, and getting tested or screened for diseases.  Follow your health care provider's instructions on monitoring your cholesterol and blood pressure. This information is not intended to replace advice given to you by your health care provider. Make sure you discuss any questions you have with your health care provider. Document Revised: 06/01/2018 Document Reviewed: 06/01/2018 Elsevier Patient Education  2020 [employer].

## 2019-12-27 NOTE — Progress Notes (Signed)
Results ok  but not as good as last year. Blood sugar is borderline  but no diabetes . Cholesterol slighlty higher than last year but still lower risk  Thyroid kidney liver tests are normal

## 2019-12-27 NOTE — Telephone Encounter (Signed)
Cologuard request form faxed to [employer] at [phone] and received confirmation fax that this went through.

## 2020-01-08 LAB — HM COLONOSCOPY

## 2020-01-09 LAB — COLOGUARD: Cologuard: NEGATIVE

## 2020-01-12 ENCOUNTER — Telehealth: Payer: Self-pay

## 2020-01-12 NOTE — Telephone Encounter (Signed)
Called patient and gave her the cologuard results which is Negative. Patient verified an understanding.

## 2020-02-01 NOTE — Progress Notes (Signed)
Chief Complaint  Patient presents with  . Stress    Will discuss  . Anxiety    will discuss    HPI: Hannah [name] 59 y.o. come in for   Consult about stress anxiety  Work related  And pressures  . No panic attacks some decrease sleep.  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  No new  Health issues .   ROS: See pertinent positives and negatives per HPI. 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 Asthma   . Chest pain, atypical   . CHEST PAIN-PRECORDIAL 10/26/2008   Qualifier: Diagnosis of  By: Burt Knack, MD, Clayburn Pert   . Chronic constipation   . Glaucoma   . H/O: hysterectomy 2011   presented with irregular bleeding  . ICP (infantile cerebral palsy) (HCC)    elevated  . Iron deficiency anemia   . MENOPAUSE, SURGICAL 01/06/2010   Qualifier: Diagnosis of  By: Regis Bill MD, Standley Brooking   . NEOPLASM, MALIGNANT, UTERUS, HX OF 01/06/2010   Qualifier: Diagnosis of  By: Regis Bill MD, Standley Brooking   . Nevus, atypical   . Osteochondroma   . OSTEOCHONDROMA 10/10/2008   Qualifier: Diagnosis of  By: Ronnald Ramp CMA, Chemira    . Rhinitis   . Uterine cancer ([place])     Family History  Problem Relation Age of Onset  . Hypertension Mother        deceased  . Alcohol abuse Mother   . Hypertension Sister   . Diabetes Father        died from complications of dm, amputation and sepsis in his 40's   . Colon polyps Other   . Breast cancer Maternal Aunt     Social History   Socioeconomic History  . Marital status: Married    Spouse name: Not on file  . Number of children: Not on file  . Years of education: Not on file  . Highest education level: Not on file  Occupational History  . Not on file  Tobacco Use  . Smoking status: Never Smoker  . Smokeless tobacco: Never  Used  Vaping Use  . Vaping Use: Never used  Substance and Sexual Activity  . Alcohol use: Yes    Alcohol/week: 0.0 standard drinks    Comment: socially  . Drug use: No  . Sexual activity: Not on file  Other Topics Concern  . 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    She does not smoke cigarettes or use recreational drugs.     She drinks alcohol on rare occasions with 1 glass of wine. Approx. Once a month.  She does not do regular exercise.   recently    HH of 3 -4hysband and 51 yo son  Has exchange student from [place] now    Pet poodle   Husband is a smoker   [name] is married with 3 children, ages  One fr college another on her own  hh of 4    Social Determinants of Health   Financial Resource Strain:   . Difficulty of Paying Living Expenses:   Food Insecurity:   . Worried About Charity fundraiser in the Last Year:   . Arboriculturist in the Last Year:   Transportation Needs:   . Film/video editor (Medical):   Marland Kitchen Lack of Transportation (Non-Medical):   Physical Activity:   . Days of Exercise per Week:   . Minutes of Exercise per Session:   Stress:   . Feeling of Stress :   Social Connections:   . Frequency of Communication with Friends and Family:   . Frequency of Social Gatherings with Friends and Family:   . Attends Religious Services:   . Active Member of Clubs or Organizations:   . Attends Archivist Meetings:   Marland Kitchen Marital Status:     Outpatient Medications Prior to Visit  Medication Sig Dispense Refill  . acetaminophen (TYLENOL) 500 MG tablet Tylenol Extra Strength 500 mg tablet   2 tablets every 4-6 hours by oral route.    Marland Kitchen albuterol (VENTOLIN HFA) 108 (90 Base) MCG/ACT inhaler USE 2 PUFFS EVERY 4 TO 6 HOURS AS NEEDED FOR SHORTNESS OF BREATH 18 g 2  . Biotin 5 MG TABS  Take by mouth.      Marland Kitchen CALCIUM PO Take by mouth.    . Calcium Polycarbophil 625 MG CHEW Chew by mouth.    . Cholecalciferol 25 MCG (1000 UT) capsule Take by mouth.    . clobetasol ointment (TEMOVATE) 0.05 % Apply to the scalp x4-5 times weekly.    . CONCERTA 18 MG CR tablet Take 18 mg by mouth daily.    Marland Kitchen doxycycline (DORYX) 100 MG EC tablet Take by mouth.    . fish oil-omega-3 fatty acids 1000 MG capsule Take 2 g by mouth daily.      Marland Kitchen ibuprofen (ADVIL) 800 MG tablet ibuprofen 800 mg tablet    . MAGNESIUM PO Take 600 mg by mouth daily.    . MULTIPLE VITAMIN PO Take by mouth.      . Multiple Vitamins-Minerals (HAIR/SKIN/NAILS) TABS Hair, Skin and Nails-Argan Oil    . Probiotic Product (PROBIOTIC FORMULA PO) Take by mouth.      . raloxifene (EVISTA) 60 MG tablet Take 60 mg by mouth daily.    . Tafluprost, PF, (ZIOPTAN) 0.0015 % SOLN Zioptan (PF) 0.0015 % eye drops in a dropperette    . Cholecalciferol (VITAMIN D PO) Take by mouth daily.  (Patient not taking: Reported on 02/02/2020)    . Tafluprost (ZIOPTAN) 0.0015 % SOLN Apply 1 drop to eye daily. (Patient not taking: Reported on 02/02/2020)    . ADDERALL XR 30 MG 24 hr capsule Take by mouth daily. (Patient not taking: Reported on 02/02/2020)     No facility-administered medications prior to visit.     EXAM:  BP 128/72   Pulse 100   Temp 98.8 F (37.1 C) (Oral)   Ht 5' 7.5" (1.715 m)   Wt 214 lb 6.4 oz (97.3 kg)   SpO2 97%   BMI 33.08 kg/m   Body mass index is 33.08 kg/m.  GENERAL: vitals reviewed and listed above, alert, oriented, appears well hydrated and in no acute distress HEENT: atraumatic, conjunctiva  clear, no obvious abnormalities on inspection of external nose and ears OP : masked  NECK: no obvious masses on inspection palpation  MS: moves all extremities without noticeable focal  abnormality PSYCH: alert  Nl thought and speech  cooperative, stressed  Nl insight   BP Readings from Last 3 Encounters:  02/02/20 128/72   12/27/19 120/74  06/30/19 128/80    ASSESSMENT AND PLAN:  Discussed the following assessment and plan:  Stress at work  Stress and adjustment reaction Situational stress and effect    at this time may be helped by CBT and  Continued supports   We discussed medication and not indicated at this time . Information given  Keep me updated about  Status .  In weeks month  35 minutes  Counseling review and assessment  -Patient advised to return or notify health care team  if  new concerns arise.  Patient Instructions  Stress reduction  Strategies  Keep [place] informed     Standley Brooking. Reynoldo Mainer M.D.

## 2020-02-02 ENCOUNTER — Ambulatory Visit: Payer: 59 | Admitting: Internal Medicine

## 2020-02-02 ENCOUNTER — Encounter: Payer: Self-pay | Admitting: Internal Medicine

## 2020-02-02 ENCOUNTER — Other Ambulatory Visit: Payer: Self-pay

## 2020-02-02 VITALS — BP 128/72 | HR 100 | Temp 98.8°F | Ht 67.5 in | Wt 214.4 lb

## 2020-02-02 DIAGNOSIS — Z566 Other physical and mental strain related to work: Secondary | ICD-10-CM | POA: Diagnosis not present

## 2020-02-02 DIAGNOSIS — F4329 Adjustment disorder with other symptoms: Secondary | ICD-10-CM

## 2020-02-02 NOTE — Patient Instructions (Signed)
Stress reduction  Strategies  Keep [place] informed

## 2020-02-14 ENCOUNTER — Encounter: Payer: Self-pay | Admitting: Gastroenterology

## 2020-06-22 HISTORY — PX: TOTAL KNEE ARTHROPLASTY: SHX125

## 2020-07-01 NOTE — Progress Notes (Signed)
Chief Complaint  Patient presents with  . Follow-up    HPI: Hannah [name] 60 y.o. come in for follow-up.   Has had onset of a cold over a week tested negative for COVID but had achiness cough head congestion but isn't better yet and hard to get back to baseline energy. Cold  Tested negative  But hard to get baseline winded .  Last  Monday.   Friday neg test.   Cough still a problemWinded .     Not used inhaler at this time. No chest pain fever chills hemoptysis at this time. Some facial pressure  Job pressure is still great considering options. Is not seriously depressed or unsafe does talk to her Marketing executive.  She is vaccinated for COVID her second 1 was April.  Will following up blood sugar she has tried to eat healthy . No major change.   ROS: See pertinent positives and negatives per HPI.  Past Medical History:  Diagnosis Date  . Asthma   . Chest pain, atypical   . CHEST PAIN-PRECORDIAL 10/26/2008   Qualifier: Diagnosis of  By: Burt Knack, MD, Clayburn Pert   . Chronic constipation   . Glaucoma   . H/O: hysterectomy 2011   presented with irregular bleeding  . ICP (infantile cerebral palsy) (HCC)    elevated  . Iron deficiency anemia   . MENOPAUSE, SURGICAL 01/06/2010   Qualifier: Diagnosis of  By: Regis Bill MD, Standley Brooking   . NEOPLASM, MALIGNANT, UTERUS, HX OF 01/06/2010   Qualifier: Diagnosis of  By: Regis Bill MD, Standley Brooking   . Nevus, atypical   . Osteochondroma   . OSTEOCHONDROMA 10/10/2008   Qualifier: Diagnosis of  By: Ronnald Ramp CMA, Chemira    . Rhinitis   . Uterine cancer ([place])     Family History  Problem Relation Age of Onset  . Hypertension Mother        deceased  . Alcohol abuse Mother   . Hypertension Sister   . Colon polyps Sister   . Diabetes Father        died from complications of dm, amputation and sepsis in his 33's   . Colon polyps Father   . Colon polyps Other   . Breast cancer Maternal Aunt     Social History   Socioeconomic History  .  Marital status: Married    Spouse name: Not on file  . Number of children: Not on file  . Years of education: Not on file  . Highest education level: Not on file  Occupational History  . Not on file  Tobacco Use  . Smoking status: Never Smoker  . Smokeless tobacco: Never Used  Vaping Use  . Vaping Use: Never used  Substance and Sexual Activity  . Alcohol use: Yes    Alcohol/week: 0.0 standard drinks    Comment: socially  . Drug use: No  . Sexual activity: Not on file  Other Topics Concern  . 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    She does not smoke cigarettes or use recreational drugs.     She drinks alcohol on rare occasions with 1 glass of wine. Approx. Once a month.  She does not do regular exercise.   recently    HH of 3 -4hysband and 13  yo son  Has exchange student from [place] now    Pet poodle   Husband is a smoker   [name] is married with 3 children, ages  One fr college another on her own       hh of 4    Social Determinants of Radio broadcast assistant Strain: Not on file  Food Insecurity: Not on file  Transportation Needs: Not on file  Physical Activity: Not on file  Stress: Not on file  Social Connections: Not on file    Outpatient Medications Prior to Visit  Medication Sig Dispense Refill  . acetaminophen (TYLENOL) 500 MG tablet Tylenol Extra Strength 500 mg tablet   2 tablets every 4-6 hours by oral route.    . Biotin 5 MG TABS Take by mouth.    Marland Kitchen CALCIUM PO Take by mouth.    . Calcium Polycarbophil 625 MG CHEW Chew by mouth.    . Cholecalciferol (VITAMIN D PO) Take by mouth daily.    . Cholecalciferol 25 MCG (1000 UT) capsule Take by mouth.    . clobetasol ointment (TEMOVATE) 0.05 % Apply to the scalp x4-5 times weekly.    . CONCERTA 18 MG CR tablet Take 18 mg by mouth daily.     Marland Kitchen doxycycline (DORYX) 100 MG EC tablet Take by mouth.    . fish oil-omega-3 fatty acids 1000 MG capsule Take 2 g by mouth daily.    Marland Kitchen ibuprofen (ADVIL) 800 MG tablet ibuprofen 800 mg tablet    . MAGNESIUM PO Take 600 mg by mouth daily.    . MULTIPLE VITAMIN PO Take by mouth.    . Multiple Vitamins-Minerals (HAIR/SKIN/NAILS) TABS Hair, Skin and Nails-Argan Oil    . Probiotic Product (PROBIOTIC FORMULA PO) Take by mouth.    . raloxifene (EVISTA) 60 MG tablet Take 60 mg by mouth daily.    . Tafluprost, PF, (ZIOPTAN) 0.0015 % SOLN Zioptan (PF) 0.0015 % eye drops in a dropperette    . albuterol (VENTOLIN HFA) 108 (90 Base) MCG/ACT inhaler USE 2 PUFFS EVERY 4 TO 6 HOURS AS NEEDED FOR SHORTNESS OF BREATH 18 g 2  . Tafluprost (ZIOPTAN) 0.0015 % SOLN Apply 1 drop to eye daily. (Patient not taking: Reported on 02/02/2020)     No facility-administered medications prior to visit.     EXAM:  BP 100/70 (BP Location: Right Arm, Patient Position: Sitting, Cuff Size: Large)   Pulse 69   Temp 97.9 F (36.6 C) (Oral)   Ht 5' 7.5" (1.715 m)   Wt 205 lb 6.4 oz (93.2 kg)   SpO2 99%   BMI 31.70 kg/m   Body mass index is 31.7 kg/m.  GENERAL: vitals reviewed and listed above, alert, oriented, appears well hydrated and in no acute distress congested occasional cough. HEENT: atraumatic, conjunctiva  clear, no obvious abnormalities on inspection of external nose and ears TMs are clear face slightly tender maxilla. No periorbital edema OP : Masked NECK: no obvious masses on inspection palpation  LUNGS: clear to auscultation bilaterally, no wheezes, rales or rhonchi, question slight decreased air movement no dyspnea CV: HRRR, no clubbing cyanosis or  peripheral edema nl cap refill  MS: moves all extremities without noticeable focal  abnormality PSYCH: pleasant and cooperative, slightly down normal cognition interactive and eye contact Lab Results  Component Value Date   WBC 6.7 12/27/2019   HGB 13.9  12/27/2019   HCT 41.7 12/27/2019   PLT 284.0 12/27/2019   GLUCOSE 103 (H)  12/27/2019   CHOL 185 12/27/2019   TRIG 83.0 12/27/2019   HDL 44.50 12/27/2019   LDLDIRECT 137.9 10/10/2008   LDLCALC 124 (H) 12/27/2019   ALT 11 12/27/2019   AST 13 12/27/2019   NA 139 12/27/2019   K 3.9 12/27/2019   CL 105 12/27/2019   CREATININE 1.03 12/27/2019   BUN 13 12/27/2019   CO2 28 12/27/2019   TSH 1.25 12/27/2019   HGBA1C 5.6 07/02/2020   BP Readings from Last 3 Encounters:  07/02/20 100/70  02/02/20 128/72  12/27/19 120/74  Chest x-ray no acute disease pulse ox repeat 99%  ASSESSMENT AND PLAN:  Discussed the following assessment and plan:  Acute respiratory infection - Plan: DG Chest 2 View  Respiratory tract congestion with cough - Plan: DG Chest 2 View  Hyperglycemia - Plan: POC HgB A1c  Dyspnea, unspecified type - Plan: DG Chest 2 View  Uncomplicated asthma, unspecified asthma severity, unspecified whether persistent Has had what sounds like an acute viral respiratory infection with significant sinus congestion having to wear mask an underlying asthmatic component and past was probably giving her windedness. Otherwise her exam and x-ray are reassuring try nasal saline if facial pain and pressure is not improving after a 10-day mark or getting worse contact us for consideration of antibiotic for sinusitis. Otherwise use beta agonist as needed for the shortness of breath to get through this illness.  Blood sugar is much better based on A1c down to 5.6 in the normal range. Continue her support network and plan follow-up for her physical in the summer let us know if she needs help in the interim. -Patient advised to return or notify health care team  if  new concerns arise.  Patient Instructions  Use albuterol inhaler  durin this time with spacer.  Acts like viral resp infection  With sinus involvement  And should  Improve in the next 3- 4 days    Asthma could flare temporarily.    If   persistent or progressive contact medical team for   Help.   If sinus pressure  Not  Getting better sometimes antibiotic can help.   Chest x ray is normal   Reassuring     Blood sugar is better. Follow lipids    10 year low risk at this time. The 10-year ASCVD risk score Mikey Bussing DC Brooke Bonito., et al., 2013) is: 2.4%   Values used to calculate the score:     Age: 68 years     Sex: Female     Is Non-Hispanic African American: Yes     Diabetic: No     Tobacco smoker: No     Systolic Blood Pressure: 353 mmHg     Is BP treated: No     HDL Cholesterol: 44.5 mg/dL     Total Cholesterol: 185 mg/dL     Standley Brooking. Camden Knotek M.D.  The 10-year ASCVD risk score Mikey Bussing DC Brooke Bonito., et al., 2013) is: 2.4%   Values used to calculate the score:     Age: 24 years     Sex: Female     Is Non-Hispanic African American: Yes     Diabetic: No     Tobacco smoker: No     Systolic Blood Pressure: 299 mmHg     Is BP treated: No     HDL Cholesterol: 44.5 mg/dL     Total Cholesterol: 185 mg/dL

## 2020-07-02 ENCOUNTER — Encounter: Payer: Self-pay | Admitting: Internal Medicine

## 2020-07-02 ENCOUNTER — Ambulatory Visit (INDEPENDENT_AMBULATORY_CARE_PROVIDER_SITE_OTHER): Payer: 59

## 2020-07-02 ENCOUNTER — Other Ambulatory Visit: Payer: Self-pay

## 2020-07-02 ENCOUNTER — Ambulatory Visit: Payer: 59 | Admitting: Internal Medicine

## 2020-07-02 VITALS — BP 100/70 | HR 69 | Temp 97.9°F | Ht 67.5 in | Wt 205.4 lb

## 2020-07-02 DIAGNOSIS — R0600 Dyspnea, unspecified: Secondary | ICD-10-CM

## 2020-07-02 DIAGNOSIS — R058 Other specified cough: Secondary | ICD-10-CM | POA: Diagnosis not present

## 2020-07-02 DIAGNOSIS — R739 Hyperglycemia, unspecified: Secondary | ICD-10-CM

## 2020-07-02 DIAGNOSIS — J45909 Unspecified asthma, uncomplicated: Secondary | ICD-10-CM

## 2020-07-02 DIAGNOSIS — J22 Unspecified acute lower respiratory infection: Secondary | ICD-10-CM | POA: Diagnosis not present

## 2020-07-02 LAB — POCT GLYCOSYLATED HEMOGLOBIN (HGB A1C): Hemoglobin A1C: 5.6 % (ref 4.0–5.6)

## 2020-07-02 MED ORDER — ALBUTEROL SULFATE HFA 108 (90 BASE) MCG/ACT IN AERS: INHALATION_SPRAY | RESPIRATORY_TRACT | 2 refills | Status: DC

## 2020-07-02 NOTE — Patient Instructions (Addendum)
Use albuterol inhaler  durin this time with spacer.  Acts like viral resp infection  With sinus involvement  And should  Improve in the next 3- 4 days    Asthma could flare temporarily.   If   persistent or progressive contact medical team for   Help.   If sinus pressure  Not  Getting better sometimes antibiotic can help.   Chest x ray is normal   Reassuring     Blood sugar is better. Follow lipids    10 year low risk at this time. The 10-year ASCVD risk score Mikey Bussing DC Brooke Bonito., et al., 2013) is: 2.4%   Values used to calculate the score:     Age: 60 years     Sex: Female     Is Non-Hispanic African American: Yes     Diabetic: No     Tobacco smoker: No     Systolic Blood Pressure: 109 mmHg     Is BP treated: No     HDL Cholesterol: 44.5 mg/dL     Total Cholesterol: 185 mg/dL

## 2020-07-22 LAB — HM MAMMOGRAPHY

## 2020-09-12 ENCOUNTER — Ambulatory Visit: Payer: 59 | Attending: Internal Medicine

## 2020-09-12 DIAGNOSIS — Z23 Encounter for immunization: Secondary | ICD-10-CM

## 2020-09-12 NOTE — Progress Notes (Signed)
   Covid-19 Vaccination Clinic  Name:  Hannah [name]    MRN: [id number] DOB: [birth year]  09/12/2020  [name] was observed post Covid-19 immunization for 15 minutes without incident. She was provided with Vaccine Information Sheet and instruction to access the V-Safe system.   [name] was instructed to call 911 with any severe reactions post vaccine: Marland Kitchen Difficulty breathing  . Swelling of face and throat  . A fast heartbeat  . A bad rash all over body  . Dizziness and weakness   Immunizations Administered    Name Date Dose VIS Date Route   PFIZER Comrnaty(Gray TOP) Covid-19 Vaccine 09/12/2020 10:53 AM 0.3 mL 05/30/2020 Intramuscular   Manufacturer: [employer], [employer]   Lot: [id number]   [place]: [phone]

## 2020-09-19 ENCOUNTER — Encounter: Payer: Self-pay | Admitting: Internal Medicine

## 2020-12-24 ENCOUNTER — Encounter: Payer: 59 | Admitting: Internal Medicine

## 2020-12-27 ENCOUNTER — Encounter: Payer: 59 | Admitting: Internal Medicine

## 2020-12-29 NOTE — Progress Notes (Signed)
Chief Complaint  Patient presents with   Annual Exam     HPI: Patient  Hannah [name]  60 y.o. comes in today for Preventive Health Care visit . Surgery  April 2022  Max langfit.   For left knee job injury replacement. She is doing quite well does have a routine form for foster children 1 age 71 and 39 age 34  No exposure to TB and no symptoms of such.  Feels that her asthma is stable still sees eye doctor and GYN.  Small bump behind right ear has been there couple years noted nontender not seemingly growing firm Health Maintenance  Topic Date Due   Pneumococcal Vaccine 23-62 Years old (3 - PPSV23 or PCV20) 01/22/2015   PAP SMEAR-Modifier  03/23/2019   COVID-19 Vaccine (4 - Booster for Pfizer series) 12/13/2020   INFLUENZA VACCINE  01/20/2021   MAMMOGRAM  07/22/2022   TETANUS/TDAP  08/10/2024   COLONOSCOPY (Pts 45-70yrs Insurance coverage will need to be confirmed)  01/08/2027   Hepatitis C Screening  Completed   HIV Screening  Completed   Zoster Vaccines- Shingrix  Completed   HPV VACCINES  Aged Out   Health Maintenance Review LIFESTYLE:  Exercise: Limited by knee at this time but in rehab Tobacco/ETS:n Alcohol: n Sugar beverages:ginger ale weekly  Sleep: about 7 hours  Drug use: no HH of   5  1 pet poodle  Work: curerently  on  medical leave .  FO  joints   cholesterol . Considering adding Tumeric  :    ROS:  GEN/ HEENT: No fever, significant weight changes sweats headaches vision problems hearing changes, CV/ PULM; No chest pain shortness of breath cough, syncope,edema  change in exercise tolerance. GI /GU: No adominal pain, vomiting, change in bowel habits. No blood in the stool. No significant GU symptoms. SKIN/HEME: ,no acute skin rashes suspicious lesions or bleeding. No lymphadenopathy, nodules, masses.  NEURO/ PSYCH:  No neurologic signs such as weakness numbness. No depression anxiety.  Doing much better adapting. IMM/ Allergy: No unusual infections.   Allergy .   REST of 12 system review negative except as per HPI   Past Medical History:  Diagnosis Date   Asthma    Chest pain, atypical    CHEST PAIN-PRECORDIAL 10/26/2008   Qualifier: Diagnosis of  By: Burt Knack, MD, Clayburn Pert    Chronic constipation    Glaucoma    H/O: hysterectomy 2011   presented with irregular bleeding   ICP (infantile cerebral palsy) (HCC)    elevated   Iron deficiency anemia    MENOPAUSE, SURGICAL 01/06/2010   Qualifier: Diagnosis of  By: Regis Bill MD, Standley Brooking    NEOPLASM, MALIGNANT, UTERUS, HX OF 01/06/2010   Qualifier: Diagnosis of  By: Regis Bill MD, Standley Brooking    Nevus, atypical    Osteochondroma    OSTEOCHONDROMA 10/10/2008   Qualifier: Diagnosis of  By: Ronnald Ramp CMA, Chemira     Rhinitis    Uterine cancer [facility])     Past Surgical History:  Procedure Laterality Date   CESAREAN SECTION     FOOT SURGERY     MENISCUS REPAIR  01/2019   Left side   MYOMECTOMY     TOTAL VAGINAL HYSTERECTOMY  2011   TUBAL LIGATION     UMBILICAL HERNIA REPAIR      Family History  Problem Relation Age of Onset   Hypertension Mother        deceased   Alcohol abuse Mother  Hypertension Sister    Colon polyps Sister    Diabetes Father        died from complications of dm, amputation and sepsis in his 64's    Colon polyps Father    Colon polyps Other    Breast cancer Maternal Aunt     Social History   Socioeconomic History   Marital status: Married    Spouse name: Not on file   Number of children: Not on file   Years of education: Not on file   Highest education level: Not on file  Occupational History   Not on file  Tobacco Use   Smoking status: Never   Smokeless tobacco: Never  Vaping Use   Vaping Use: Never used  Substance and Sexual Activity   Alcohol use: Yes    Alcohol/week: 0.0 standard drinks    Comment: socially   Drug use: No   Sexual activity: Not on file  Other Topics Concern   Not on file  Social History Narrative   The patient works as a  Engineer, structural.     She has been in the profession for 25 years.     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    She does not smoke cigarettes or use recreational drugs.     She drinks alcohol on rare occasions with 1 glass of wine. Approx. Once a month.  She does not do regular exercise.   recently    HH of 3 -4hysband and 8 yo son  Has exchange student from [place] now    Pet poodle   Husband is a smoker   [name] is married with 3 children, ages  One fr college another on her own       hh of 4    Social Determinants of Radio broadcast assistant Strain: Not on file  Food Insecurity: Not on file  Transportation Needs: Not on file  Physical Activity: Not on file  Stress: Not on file  Social Connections: Not on file    Outpatient Medications Prior to Visit  Medication Sig Dispense Refill   acetaminophen (TYLENOL) 500 MG tablet Tylenol Extra Strength 500 mg tablet   2 tablets every 4-6 hours by oral route.     albuterol (VENTOLIN HFA) 108 (90 Base) MCG/ACT inhaler USE 2 PUFFS EVERY 4 TO 6 HOURS AS NEEDED FOR SHORTNESS OF BREATH 18 g 2   Biotin 5 MG TABS Take by mouth.     CALCIUM PO Take by mouth.     Calcium Polycarbophil 625 MG CHEW Chew by mouth.     Cholecalciferol (VITAMIN D PO) Take by mouth daily.     Cholecalciferol 25 MCG (1000 UT) capsule Take by mouth.     clobetasol ointment (TEMOVATE) 0.05 % Apply to the scalp x4-5 times weekly.     CONCERTA 18 MG CR tablet Take 18 mg by mouth daily.     doxycycline (DORYX) 100 MG EC tablet Take by mouth.     fish oil-omega-3 fatty acids 1000 MG capsule Take 2 g by mouth daily.     ibuprofen (ADVIL) 800 MG tablet ibuprofen 800 mg tablet     MAGNESIUM PO Take 600 mg by mouth daily.     MULTIPLE VITAMIN PO Take by mouth.     Multiple Vitamins-Minerals (HAIR/SKIN/NAILS) TABS Hair, Skin and Nails-Argan Oil     Probiotic Product (PROBIOTIC FORMULA PO) Take by mouth.     raloxifene (EVISTA)  60 MG  tablet Take 60 mg by mouth daily.     Tafluprost, PF, (ZIOPTAN) 0.0015 % SOLN Zioptan (PF) 0.0015 % eye drops in a dropperette     No facility-administered medications prior to visit.     EXAM:  BP 128/86 (BP Location: Left Arm, Patient Position: Sitting, Cuff Size: Normal)   Pulse 79   Temp (!) 97.5 F (36.4 C) (Temporal)   Ht 5' 7.5" (1.715 m)   Wt 212 lb 3.2 oz (96.3 kg)   SpO2 97%   BMI 32.74 kg/m   Body mass index is 32.74 kg/m. Wt Readings from Last 3 Encounters:  12/30/20 212 lb 3.2 oz (96.3 kg)  07/02/20 205 lb 6.4 oz (93.2 kg)  02/02/20 214 lb 6.4 oz (97.3 kg)    Physical Exam: Vital signs reviewed LKG:MWNU is a well-developed well-nourished alert cooperative    who appearsr stated age in no acute distress.  HEENT: normocephalic atraumatic , Eyes: PERRL EOM's full, conjunctiva clear, Nares: paten,t no deformity discharge or tenderness., Ears: no deformity EAC's clear TMs with normal landmarks. Mouth:masked  NECK: supple without masses, thyromegaly or bruits. CHEST/PULM:  Clear to auscultation and percussion breath sounds equal no wheeze , rales or rhonchi. No chest wall deformities or tenderness. Breast: normal by inspection . No dimpling, discharge, masses, tenderness or discharge . CV: PMI is nondisplaced, S1 S2 no gallops, murmurs, rubs. Peripheral pulses are full without delay.No JVD .  ABDOMEN: Bowel sounds normal nontender  No guard or rebound, no hepato splenomegal no CVA tenderness.  No hernia. Extremtities:  No clubbing cyanosis or edema, no acute joint swelling or redness no focal atrophy healed surgical scar no redness or warmth NEURO:  Oriented x3, cranial nerves 3-12 appear to be intact, no obvious focal weakness,gait within normal limits no abnormal reflexes or asymmetrical SKIN: No acute rashes normal turgor, color, no bruising or petechiae.  Retroauricular area there is a BB sized firm smooth nodule seemingly attached to skin no obvious cyst entry but  hair follicle no redness or adenopathy. PSYCH: Oriented, good eye contact, no obvious depression anxiety, cognition and judgment appear normal. LN: no cervical axillary inguinal adenopathy  Lab Results  Component Value Date   WBC 6.7 12/27/2019   HGB 13.9 12/27/2019   HCT 41.7 12/27/2019   PLT 284.0 12/27/2019   GLUCOSE 103 (H) 12/27/2019   CHOL 185 12/27/2019   TRIG 83.0 12/27/2019   HDL 44.50 12/27/2019   LDLDIRECT 137.9 10/10/2008   LDLCALC 124 (H) 12/27/2019   ALT 11 12/27/2019   AST 13 12/27/2019   NA 139 12/27/2019   K 3.9 12/27/2019   CL 105 12/27/2019   CREATININE 1.03 12/27/2019   BUN 13 12/27/2019   CO2 28 12/27/2019   TSH 1.25 12/27/2019   HGBA1C 5.6 07/02/2020    BP Readings from Last 3 Encounters:  12/30/20 128/86  07/02/20 100/70  02/02/20 128/72    Lab plan reviewed with patient   ASSESSMENT AND PLAN:  Discussed the following assessment and plan:    ICD-10-CM   1. Visit for preventive health examination  U72.53 Basic metabolic panel    CBC with Differential/Platelet    Hemoglobin A1c    Hepatic function panel    Lipid panel    TSH    Basic metabolic panel    CBC with Differential/Platelet    Hemoglobin A1c    Hepatic function panel    Lipid panel    TSH    2. Hyperglycemia  N56.2 Basic metabolic panel    CBC with Differential/Platelet    Hemoglobin A1c    Hepatic function panel    Lipid panel    TSH    Basic metabolic panel    CBC with Differential/Platelet    Hemoglobin A1c    Hepatic function panel    Lipid panel    TSH    3. Uncomplicated asthma, unspecified asthma severity, unspecified whether persistent  Z30.865 Basic metabolic panel    CBC with Differential/Platelet    Hemoglobin A1c    Hepatic function panel    Lipid panel    TSH    Basic metabolic panel    CBC with Differential/Platelet    Hemoglobin A1c    Hepatic function panel    Lipid panel    TSH   quiescent     4. Single skin nodule  R22.9     5. H/O left  knee surgery  Z98.890     [facility] care certification form completed and signed no concerns Lab monitoring Get BMI below 30. Skin nodule may be a cyst and is small but firm.  Consider fibroma also.  Continue to watch and survey if increasing in size or other changes will reassess follow possible dermatology consult. Return in about 1 year (around 12/30/2021) for depending on results.  Patient Care Team: Corydon Schweiss, Standley Brooking, MD as PCP - General Servando Salina, MD (Obstetrics and Gynecology) Sherren Mocha, MD (Cardiology) Gaynelle Arabian, MD as Consulting Physician (Orthopedic Surgery) Patient Instructions  Glad  you are doing well.   Continue l to l attention ifestyle intervention healthy eating and exercise .   Get BMI under 30 .    Health Maintenance, Female Adopting a healthy lifestyle and getting preventive care are important in promoting health and wellness. Ask your health care provider about: The right schedule for you to have regular tests and exams. Things you can do on your own to prevent diseases and keep yourself healthy. What should I know about diet, weight, and exercise? Eat a healthy diet  Eat a diet that includes plenty of vegetables, fruits, low-fat dairy products, and lean protein. Do not eat a lot of foods that are high in solid fats, added sugars, or sodium.  Maintain a healthy weight Body mass index (BMI) is used to identify weight problems. It estimates body fat based on height and weight. Your health care provider can help determineyour BMI and help you achieve or maintain a healthy weight. Get regular exercise Get regular exercise. This is one of the most important things you can do for your health. Most adults should: Exercise for at least 150 minutes each week. The exercise should increase your heart rate and make you sweat (moderate-intensity exercise). Do strengthening exercises at least twice a week. This is in addition to the moderate-intensity  exercise. Spend less time sitting. Even light physical activity can be beneficial. Watch cholesterol and blood lipids Have your blood tested for lipids and cholesterol at 60 years of age, then havethis test every 5 years. Have your cholesterol levels checked more often if: Your lipid or cholesterol levels are high. You are older than 60 years of age. You are at high risk for heart disease. What should I know about cancer screening? Depending on your health history and family history, you may need to have cancer screening at various ages. This may include screening for: Breast cancer. Cervical cancer. Colorectal cancer. Skin cancer. Lung cancer. What should I know about heart disease, diabetes,  and high blood pressure? Blood pressure and heart disease High blood pressure causes heart disease and increases the risk of stroke. This is more likely to develop in people who have high blood pressure readings, are of African descent, or are overweight. Have your blood pressure checked: Every 3-5 years if you are 6-47 years of age. Every year if you are 109 years old or older. Diabetes Have regular diabetes screenings. This checks your fasting blood sugar level. Have the screening done: Once every three years after age 18 if you are at a normal weight and have a low risk for diabetes. More often and at a younger age if you are overweight or have a high risk for diabetes. What should I know about preventing infection? Hepatitis B If you have a higher risk for hepatitis B, you should be screened for this virus. Talk with your health care provider to find out if you are at risk forhepatitis B infection. Hepatitis C Testing is recommended for: Everyone born from [birth year] through 1965. Anyone with known risk factors for hepatitis C. Sexually transmitted infections (STIs) Get screened for STIs, including gonorrhea and chlamydia, if: You are sexually active and are younger than 60 years of age. You  are older than 60 years of age and your health care provider tells you that you are at risk for this type of infection. Your sexual activity has changed since you were last screened, and you are at increased risk for chlamydia or gonorrhea. Ask your health care provider if you are at risk. Ask your health care provider about whether you are at high risk for HIV. Your health care provider may recommend a prescription medicine to help prevent HIV infection. If you choose to take medicine to prevent HIV, you should first get tested for HIV. You should then be tested every 3 months for as long as you are taking the medicine. Pregnancy If you are about to stop having your period (premenopausal) and you may become pregnant, seek counseling before you get pregnant. Take 400 to 800 micrograms (mcg) of folic acid every day if you become pregnant. Ask for birth control (contraception) if you want to prevent pregnancy. Osteoporosis and menopause Osteoporosis is a disease in which the bones lose minerals and strength with aging. This can result in bone fractures. If you are 39 years old or older, or if you are at risk for osteoporosis and fractures, ask your health care provider if you should: Be screened for bone loss. Take a calcium or vitamin D supplement to lower your risk of fractures. Be given hormone replacement therapy (HRT) to treat symptoms of menopause. Follow these instructions at home: Lifestyle Do not use any products that contain nicotine or tobacco, such as cigarettes, e-cigarettes, and chewing tobacco. If you need help quitting, ask your health care provider. Do not use street drugs. Do not share needles. Ask your health care provider for help if you need support or information about quitting drugs. Alcohol use Do not drink alcohol if: Your health care provider tells you not to drink. You are pregnant, may be pregnant, or are planning to become pregnant. If you drink alcohol: Limit how  much you use to 0-1 drink a day. Limit intake if you are breastfeeding. Be aware of how much alcohol is in your drink. In the [place] one drink equals one 12 oz bottle of beer (355 mL), one 5 oz glass of wine (148 mL), or one 1 oz glass of hard liquor (44  mL). General instructions Schedule regular health, dental, and eye exams. Stay current with your vaccines. Tell your health care provider if: You often feel depressed. You have ever been abused or do not feel safe at home. Summary Adopting a healthy lifestyle and getting preventive care are important in promoting health and wellness. Follow your health care provider's instructions about healthy diet, exercising, and getting tested or screened for diseases. Follow your health care provider's instructions on monitoring your cholesterol and blood pressure. This information is not intended to replace advice given to you by your health care provider. Make sure you discuss any questions you have with your healthcare provider. Document Revised: 06/01/2018 Document Reviewed: 06/01/2018 Elsevier Patient Education  2022 [employer]. Shraga Custard M.D.

## 2020-12-30 ENCOUNTER — Other Ambulatory Visit: Payer: Self-pay

## 2020-12-30 ENCOUNTER — Encounter: Payer: Self-pay | Admitting: Internal Medicine

## 2020-12-30 ENCOUNTER — Ambulatory Visit (INDEPENDENT_AMBULATORY_CARE_PROVIDER_SITE_OTHER): Payer: 59 | Admitting: Internal Medicine

## 2020-12-30 VITALS — BP 128/86 | HR 79 | Temp 97.5°F | Ht 67.5 in | Wt 212.2 lb

## 2020-12-30 DIAGNOSIS — Z0000 Encounter for general adult medical examination without abnormal findings: Secondary | ICD-10-CM

## 2020-12-30 DIAGNOSIS — R739 Hyperglycemia, unspecified: Secondary | ICD-10-CM

## 2020-12-30 DIAGNOSIS — R229 Localized swelling, mass and lump, unspecified: Secondary | ICD-10-CM

## 2020-12-30 DIAGNOSIS — Z9889 Other specified postprocedural states: Secondary | ICD-10-CM

## 2020-12-30 DIAGNOSIS — J45909 Unspecified asthma, uncomplicated: Secondary | ICD-10-CM

## 2020-12-30 LAB — TSH: TSH: 1.64 u[IU]/mL (ref 0.35–5.50)

## 2020-12-30 LAB — HEPATIC FUNCTION PANEL
ALT: 10 U/L (ref 0–35)
AST: 12 U/L (ref 0–37)
Albumin: 4.4 g/dL (ref 3.5–5.2)
Alkaline Phosphatase: 73 U/L (ref 39–117)
Bilirubin, Direct: 0.1 mg/dL (ref 0.0–0.3)
Total Bilirubin: 0.4 mg/dL (ref 0.2–1.2)
Total Protein: 7.1 g/dL (ref 6.0–8.3)

## 2020-12-30 LAB — CBC WITH DIFFERENTIAL/PLATELET
Basophils Absolute: 0 10*3/uL (ref 0.0–0.1)
Basophils Relative: 0.3 % (ref 0.0–3.0)
Eosinophils Absolute: 0.1 10*3/uL (ref 0.0–0.7)
Eosinophils Relative: 1.8 % (ref 0.0–5.0)
HCT: 38.8 % (ref 36.0–46.0)
Hemoglobin: 12.8 g/dL (ref 12.0–15.0)
Lymphocytes Relative: 38.3 % (ref 12.0–46.0)
Lymphs Abs: 2.4 10*3/uL (ref 0.7–4.0)
MCHC: 33 g/dL (ref 30.0–36.0)
MCV: 82.7 fl (ref 78.0–100.0)
Monocytes Absolute: 0.5 10*3/uL (ref 0.1–1.0)
Monocytes Relative: 8.5 % (ref 3.0–12.0)
Neutro Abs: 3.2 10*3/uL (ref 1.4–7.7)
Neutrophils Relative %: 51.1 % (ref 43.0–77.0)
Platelets: 271 10*3/uL (ref 150.0–400.0)
RBC: 4.7 Mil/uL (ref 3.87–5.11)
RDW: 14.3 % (ref 11.5–15.5)
WBC: 6.3 10*3/uL (ref 4.0–10.5)

## 2020-12-30 LAB — BASIC METABOLIC PANEL
BUN: 17 mg/dL (ref 6–23)
CO2: 28 mEq/L (ref 19–32)
Calcium: 9.8 mg/dL (ref 8.4–10.5)
Chloride: 104 mEq/L (ref 96–112)
Creatinine, Ser: 0.95 mg/dL (ref 0.40–1.20)
GFR: 65.31 mL/min (ref >60.00)
Glucose, Bld: 95 mg/dL (ref 70–99)
Potassium: 4.3 mEq/L (ref 3.5–5.1)
Sodium: 138 mEq/L (ref 135–145)

## 2020-12-30 LAB — LIPID PANEL
Cholesterol: 202 mg/dL — ABNORMAL HIGH (ref 0–200)
HDL: 47.5 mg/dL (ref >39.00)
LDL Cholesterol: 139 mg/dL — ABNORMAL HIGH (ref 0–99)
NonHDL: 154.07
Total CHOL/HDL Ratio: 4
Triglycerides: 76 mg/dL (ref 0.0–149.0)
VLDL: 15.2 mg/dL (ref 0.0–40.0)

## 2020-12-30 LAB — HEMOGLOBIN A1C: Hgb A1c MFr Bld: 5.9 % (ref 4.6–6.5)

## 2020-12-30 NOTE — Patient Instructions (Signed)
Glad  you are doing well.   Continue l to l attention ifestyle intervention healthy eating and exercise .   Get BMI under 30 .    Health Maintenance, Female Adopting a healthy lifestyle and getting preventive care are important in promoting health and wellness. Ask your health care provider about: The right schedule for you to have regular tests and exams. Things you can do on your own to prevent diseases and keep yourself healthy. What should I know about diet, weight, and exercise? Eat a healthy diet  Eat a diet that includes plenty of vegetables, fruits, low-fat dairy products, and lean protein. Do not eat a lot of foods that are high in solid fats, added sugars, or sodium.  Maintain a healthy weight Body mass index (BMI) is used to identify weight problems. It estimates body fat based on height and weight. Your health care provider can help determineyour BMI and help you achieve or maintain a healthy weight. Get regular exercise Get regular exercise. This is one of the most important things you can do for your health. Most adults should: Exercise for at least 150 minutes each week. The exercise should increase your heart rate and make you sweat (moderate-intensity exercise). Do strengthening exercises at least twice a week. This is in addition to the moderate-intensity exercise. Spend less time sitting. Even light physical activity can be beneficial. Watch cholesterol and blood lipids Have your blood tested for lipids and cholesterol at 60 years of age, then havethis test every 5 years. Have your cholesterol levels checked more often if: Your lipid or cholesterol levels are high. You are older than 60 years of age. You are at high risk for heart disease. What should I know about cancer screening? Depending on your health history and family history, you may need to have cancer screening at various ages. This may include screening for: Breast cancer. Cervical cancer. Colorectal  cancer. Skin cancer. Lung cancer. What should I know about heart disease, diabetes, and high blood pressure? Blood pressure and heart disease High blood pressure causes heart disease and increases the risk of stroke. This is more likely to develop in people who have high blood pressure readings, are of African descent, or are overweight. Have your blood pressure checked: Every 3-5 years if you are 21-23 years of age. Every year if you are 59 years old or older. Diabetes Have regular diabetes screenings. This checks your fasting blood sugar level. Have the screening done: Once every three years after age 61 if you are at a normal weight and have a low risk for diabetes. More often and at a younger age if you are overweight or have a high risk for diabetes. What should I know about preventing infection? Hepatitis B If you have a higher risk for hepatitis B, you should be screened for this virus. Talk with your health care provider to find out if you are at risk forhepatitis B infection. Hepatitis C Testing is recommended for: Everyone born from [birth year] through 1965. Anyone with known risk factors for hepatitis C. Sexually transmitted infections (STIs) Get screened for STIs, including gonorrhea and chlamydia, if: You are sexually active and are younger than 60 years of age. You are older than 60 years of age and your health care provider tells you that you are at risk for this type of infection. Your sexual activity has changed since you were last screened, and you are at increased risk for chlamydia or gonorrhea. Ask your health care provider  if you are at risk. Ask your health care provider about whether you are at high risk for HIV. Your health care provider may recommend a prescription medicine to help prevent HIV infection. If you choose to take medicine to prevent HIV, you should first get tested for HIV. You should then be tested every 3 months for as long as you are taking the  medicine. Pregnancy If you are about to stop having your period (premenopausal) and you may become pregnant, seek counseling before you get pregnant. Take 400 to 800 micrograms (mcg) of folic acid every day if you become pregnant. Ask for birth control (contraception) if you want to prevent pregnancy. Osteoporosis and menopause Osteoporosis is a disease in which the bones lose minerals and strength with aging. This can result in bone fractures. If you are 52 years old or older, or if you are at risk for osteoporosis and fractures, ask your health care provider if you should: Be screened for bone loss. Take a calcium or vitamin D supplement to lower your risk of fractures. Be given hormone replacement therapy (HRT) to treat symptoms of menopause. Follow these instructions at home: Lifestyle Do not use any products that contain nicotine or tobacco, such as cigarettes, e-cigarettes, and chewing tobacco. If you need help quitting, ask your health care provider. Do not use street drugs. Do not share needles. Ask your health care provider for help if you need support or information about quitting drugs. Alcohol use Do not drink alcohol if: Your health care provider tells you not to drink. You are pregnant, may be pregnant, or are planning to become pregnant. If you drink alcohol: Limit how much you use to 0-1 drink a day. Limit intake if you are breastfeeding. Be aware of how much alcohol is in your drink. In the [place] one drink equals one 12 oz bottle of beer (355 mL), one 5 oz glass of wine (148 mL), or one 1 oz glass of hard liquor (44 mL). General instructions Schedule regular health, dental, and eye exams. Stay current with your vaccines. Tell your health care provider if: You often feel depressed. You have ever been abused or do not feel safe at home. Summary Adopting a healthy lifestyle and getting preventive care are important in promoting health and wellness. Follow your health  care provider's instructions about healthy diet, exercising, and getting tested or screened for diseases. Follow your health care provider's instructions on monitoring your cholesterol and blood pressure. This information is not intended to replace advice given to you by your health care provider. Make sure you discuss any questions you have with your healthcare provider. Document Revised: 06/01/2018 Document Reviewed: 06/01/2018 Elsevier Patient Education  2022 [employer].

## 2021-01-03 NOTE — Progress Notes (Signed)
Results all in range  except cholesterol.  Attention to healthy diet and activity  should help bring this down.   Follow yearly.  The 10-year ASCVD risk score Mikey Bussing DC Brooke Bonito., et al., 2013) is: 5.7%   Values used to calculate the score:     Age: 60 years     Sex: Female     Is Non-Hispanic African American: Yes     Diabetic: No     Tobacco smoker: No     Systolic Blood Pressure: 948 mmHg     Is BP treated: No     HDL Cholesterol: 47.5 mg/dL     Total Cholesterol: 202 mg/dL

## 2021-01-09 ENCOUNTER — Encounter: Payer: Self-pay | Admitting: Gastroenterology

## 2021-02-05 ENCOUNTER — Telehealth: Payer: Self-pay

## 2021-02-05 ENCOUNTER — Ambulatory Visit (AMBULATORY_SURGERY_CENTER): Payer: Self-pay

## 2021-02-05 ENCOUNTER — Other Ambulatory Visit: Payer: Self-pay | Admitting: Gastroenterology

## 2021-02-05 ENCOUNTER — Other Ambulatory Visit: Payer: Self-pay

## 2021-02-05 VITALS — Ht 67.5 in | Wt 212.0 lb

## 2021-02-05 DIAGNOSIS — Z8371 Family history of colonic polyps: Secondary | ICD-10-CM

## 2021-02-05 MED ORDER — PEG-KCL-NACL-NASULF-NA ASC-C 100 G PO SOLR: 1.0000 | Freq: Once | ORAL | 0 refills | Status: DC

## 2021-02-05 NOTE — Telephone Encounter (Signed)
John,  Please review this patient's sx records for [facility] Everywhere and advise on [facility] procedure clearance---   During PV appt, patient reported that years ago (when she had her myomectomy) she was told that she was a "difficult intubation"; but she has had at least 3 more surgeries since then and did not have any issues or complications--patient was honest with this information and reports she can be called if additional information is needed;  Patient was notified that she may need to be rescheduled at [facility] for this procedure and verbalized that she is okay with "whatever needs to be done";

## 2021-02-05 NOTE — Telephone Encounter (Signed)
Noted on PV appt record

## 2021-02-05 NOTE — Progress Notes (Signed)
No egg or soy allergy known to patient   Patient reports issues with past sedation with any surgeries or procedures=Patient reports she has been told she had issues or difficulty with intubation ---Telephone encounter sent to [facility] for [place] clearance - please see additional charting  No FH of Malignant Hyperthermia No diet pills per patient No home 02 use per patient  No blood thinners per patient  Pt denies issues with constipation  No A fib or A flutter  EMMI video via [place] 19 guidelines implemented in PV today with Pt and RN  Pt is fully vaccinated for Covid x 2; Coupon given to pt in PV today and NO PA's for preps discussed with pt in PV today  Discussed with pt there will be an out-of-pocket cost for prep and that varies from $0 to 70 dollars   Due to the COVID-19 pandemic we are asking patients to follow certain guidelines.  Pt aware of COVID protocols and [facility] guidelines

## 2021-02-14 ENCOUNTER — Encounter: Payer: Self-pay | Admitting: Gastroenterology

## 2021-02-19 ENCOUNTER — Ambulatory Visit (AMBULATORY_SURGERY_CENTER): Payer: 59 | Admitting: Gastroenterology

## 2021-02-19 ENCOUNTER — Other Ambulatory Visit: Payer: Self-pay

## 2021-02-19 ENCOUNTER — Encounter: Payer: Self-pay | Admitting: Gastroenterology

## 2021-02-19 VITALS — BP 101/54 | HR 63 | Temp 98.4°F | Resp 14 | Ht 67.5 in | Wt 212.0 lb

## 2021-02-19 DIAGNOSIS — D125 Benign neoplasm of sigmoid colon: Secondary | ICD-10-CM

## 2021-02-19 DIAGNOSIS — D122 Benign neoplasm of ascending colon: Secondary | ICD-10-CM

## 2021-02-19 DIAGNOSIS — Z8371 Family history of colonic polyps: Secondary | ICD-10-CM | POA: Diagnosis not present

## 2021-02-19 DIAGNOSIS — Z1211 Encounter for screening for malignant neoplasm of colon: Secondary | ICD-10-CM

## 2021-02-19 MED ORDER — SODIUM CHLORIDE 0.9 % IV SOLN: 500.0000 mL | Freq: Once | INTRAVENOUS | Status: DC

## 2021-02-19 NOTE — Op Note (Signed)
[facility] Patient Name: Hannah [name] Procedure Date: 02/19/2021 7:27 AM MRN: [id number] Endoscopist: Mauri Pole , MD Age: 60 Referring MD:  Date of Birth: [birth year] Gender: Female Account #: [id number] Procedure:                Colonoscopy Indications:              Screening for colorectal malignant neoplasm, Colon                            cancer screening in patient at increased risk:                            Family history of 1st-degree relative with colon                            polyps Medicines:                Monitored Anesthesia Care Procedure:                Pre-Anesthesia Assessment:                           - Prior to the procedure, a History and Physical                            was performed, and patient medications and                            allergies were reviewed. The patient's tolerance of                            previous anesthesia was also reviewed. The risks                            and benefits of the procedure and the sedation                            options and risks were discussed with the patient.                            All questions were answered, and informed consent                            was obtained. Prior Anticoagulants: The patient has                            taken no previous anticoagulant or antiplatelet                            agents. ASA Grade Assessment: II - A patient with                            mild systemic disease. After reviewing the risks  and benefits, the patient was deemed in                            satisfactory condition to undergo the procedure.                           After obtaining informed consent, the colonoscope                            was passed under direct vision. Throughout the                            procedure, the patient's blood pressure, pulse, and                            oxygen saturations were monitored  continuously. The                            PCF-HQ190L Colonoscope was introduced through the                            anus and advanced to the the cecum, identified by                            appendiceal orifice and ileocecal valve. The                            colonoscopy was performed without difficulty. The                            patient tolerated the procedure well. The quality                            of the bowel preparation was excellent. The                            ileocecal valve, appendiceal orifice, and rectum                            were photographed. Scope In: 8:15:26 AM Scope Out: 8:37:44 AM Scope Withdrawal Time: 0 hours 10 minutes 42 seconds  Total Procedure Duration: 0 hours 22 minutes 18 seconds  Findings:                 The perianal and digital rectal examinations were                            normal.                           A less than 1 mm polyp was found in the ascending                            colon. The polyp was sessile. The polyp was removed  with a cold biopsy forceps. Resection and retrieval                            were complete.                           Two sessile polyps were found in the sigmoid colon.                            The polyps were 4 to 5 mm in size. These polyps                            were removed with a cold snare. Resection and                            retrieval were complete.                           A few small-mouthed diverticula were found in the                            sigmoid colon.                           Non-bleeding external and internal hemorrhoids were                            found during retroflexion. The hemorrhoids were                            medium-sized. Complications:            No immediate complications. Estimated Blood Loss:     Estimated blood loss was minimal. Impression:               - One less than 1 mm polyp in the ascending colon,                             removed with a cold biopsy forceps. Resected and                            retrieved.                           - Two 4 to 5 mm polyps in the sigmoid colon,                            removed with a cold snare. Resected and retrieved.                           - Diverticulosis in the sigmoid colon.                           - Non-bleeding external and internal hemorrhoids. Recommendation:           - Patient has a contact  number available for                            emergencies. The signs and symptoms of potential                            delayed complications were discussed with the                            patient. Return to normal activities tomorrow.                            Written discharge instructions were provided to the                            patient.                           - Resume previous diet.                           - Continue present medications.                           - Await pathology results.                           - Repeat colonoscopy in 3 - 5 years for                            surveillance based on pathology results. Mauri Pole, MD 02/19/2021 8:42:53 AM This report has been signed electronically.

## 2021-02-19 NOTE — Progress Notes (Signed)
VS- Hannah [name]  Pt's states no medical or surgical changes since previsit or office visit.  Called to room to assist during endoscopic procedure.  Patient ID and intended procedure confirmed with present staff. Received instructions for my participation in the procedure from the performing physician.

## 2021-02-19 NOTE — Patient Instructions (Signed)
Please read handouts provided. Continue present medications. Await pathology results.   YOU HAD AN ENDOSCOPIC PROCEDURE TODAY AT [facility]:   Refer to the procedure report that was given to you for any specific questions about what was found during the examination.  If the procedure report does not answer your questions, please call your gastroenterologist to clarify.  If you requested that your care partner not be given the details of your procedure findings, then the procedure report has been included in a sealed envelope for you to review at your convenience later.  YOU SHOULD EXPECT: Some feelings of bloating in the abdomen. Passage of more gas than usual.  Walking can help get rid of the air that was put into your GI tract during the procedure and reduce the bloating. If you had a lower endoscopy (such as a colonoscopy or flexible sigmoidoscopy) you may notice spotting of blood in your stool or on the toilet paper. If you underwent a bowel prep for your procedure, you may not have a normal bowel movement for a few days.  Please Note:  You might notice some irritation and congestion in your nose or some drainage.  This is from the oxygen used during your procedure.  There is no need for concern and it should clear up in a day or so.  SYMPTOMS TO REPORT IMMEDIATELY:  Following lower endoscopy (colonoscopy or flexible sigmoidoscopy):  Excessive amounts of blood in the stool  Significant tenderness or worsening of abdominal pains  Swelling of the abdomen that is new, acute   For urgent or emergent issues, a gastroenterologist can be reached at any hour by calling [phone]. Do not use MyChart messaging for urgent concerns.    DIET:  We do recommend a small meal at first, but then you may proceed to your regular diet.  Drink plenty of fluids but you should avoid alcoholic beverages for 24 hours.  ACTIVITY:  You should plan to take it easy for the rest of today and you  should NOT DRIVE or use heavy machinery until tomorrow (because of the sedation medicines used during the test).    FOLLOW UP: Our staff will call the number listed on your records 48-72 hours following your procedure to check on you and address any questions or concerns that you may have regarding the information given to you following your procedure. If we do not reach you, we will leave a message.  We will attempt to reach you two times.  During this call, we will ask if you have developed any symptoms of COVID 19. If you develop any symptoms (ie: fever, flu-like symptoms, shortness of breath, cough etc.) before then, please call [phone].  If you test positive for Covid 19 in the 2 weeks post procedure, please call and report this information to [place].    If any biopsies were taken you will be contacted by phone or by letter within the next 1-3 weeks.  Please call us at [phone] if you have not heard about the biopsies in 3 weeks.    SIGNATURES/CONFIDENTIALITY: You and/or your care partner have signed paperwork which will be entered into your electronic medical record.  These signatures attest to the fact that that the information above on your After Visit Summary has been reviewed and is understood.  Full responsibility of the confidentiality of this discharge information lies with you and/or your care-partner.

## 2021-02-19 NOTE — Progress Notes (Signed)
To PACU, VSS. Report to [email or web address] 

## 2021-02-19 NOTE — Progress Notes (Signed)
[facility] History and Physical   Primary Care Physician:  Burnis Medin, MD   Reason for Procedure:  Colorectal cancer screening  Plan:    Screening colonoscopy with possible interventions as needed     HPI: Hannah [name] is a very pleasant 60 y.o. female here for screening colonoscopy. Denies any nausea, vomiting, abdominal pain, melena or bright red blood per rectum  The risks and benefits as well as alternatives of endoscopic procedure(s) have been discussed and reviewed. All questions answered. The patient agrees to proceed.    Past Medical History:  Diagnosis Date   Asthma    with MDI   Chest pain, atypical    CHEST PAIN-PRECORDIAL 10/26/2008   Qualifier: Diagnosis of  By: Burt Knack, MD, Clayburn Pert    Chronic constipation    Endometrial cancer [facility]) 2011   Glaucoma    H/O: hysterectomy 06/22/2009   presented with irregular bleeding   ICP (infantile cerebral palsy) (HCC)    elevated   Iron deficiency anemia    MENOPAUSE, SURGICAL 01/06/2010   Qualifier: Diagnosis of  By: Regis Bill MD, Standley Brooking    NEOPLASM, MALIGNANT, UTERUS, HX OF 01/06/2010   Qualifier: Diagnosis of  By: Regis Bill MD, Standley Brooking    Nevus, atypical    Osteochondroma    OSTEOCHONDROMA 10/10/2008   Qualifier: Diagnosis of  By: Ronnald Ramp CMA, [place]     Rhinitis    Seasonal allergies    Uterine cancer ([place]) 2011    Past Surgical History:  Procedure Laterality Date   CESAREAN SECTION     COLONOSCOPY  2014   DB-MAC-mov(fair)-normal redundant/tort colon -couldnt view cecal pouch-7-10 yr recall   FOOT SURGERY Bilateral    MENISCUS REPAIR Left 01/2019   MYOMECTOMY     SHOULDER ARTHROSCOPY WITH OPEN ROTATOR CUFF REPAIR Left    TOTAL KNEE ARTHROPLASTY Left 2022   TOTAL VAGINAL HYSTERECTOMY  2011   TUBAL LIGATION     UMBILICAL HERNIA REPAIR     WISDOM TOOTH EXTRACTION      Prior to Admission medications   Medication Sig Start Date End Date Taking? Authorizing Provider   acetaminophen (TYLENOL) 500 MG tablet Tylenol Extra Strength 500 mg tablet   2 tablets every 4-6 hours by oral route.   Yes [provider]  amoxicillin (AMOXIL) 500 MG capsule Take 4 capsules by mouth daily as needed. With dental procedures 02/02/21  Yes [provider]  CALCIUM PO Take by mouth.   Yes [provider]  Cholecalciferol 25 MCG (1000 UT) capsule Take by mouth.   Yes [provider]  clobetasol ointment (TEMOVATE) 0.05 % Apply to the scalp x4-5 times weekly. 01/29/20  Yes [provider]  CONCERTA 18 MG CR tablet Take 18 mg by mouth daily. 09/07/19  Yes [provider]  cyclobenzaprine (FLEXERIL) 5 MG tablet Take 1 tablet by mouth 2 (two) times daily as needed. 01/29/21  Yes [provider]  raloxifene (EVISTA) 60 MG tablet Take 60 mg by mouth daily.   Yes [provider]  Tafluprost, PF, (ZIOPTAN) 0.0015 % SOLN Zioptan (PF) 0.0015 % eye drops in a dropperette   Yes [provider]  timolol (TIMOPTIC) 0.5 % ophthalmic solution Place 1 drop into both eyes daily. 01/21/21  Yes [provider]  albuterol (VENTOLIN HFA) 108 (90 Base) MCG/ACT inhaler USE 2 PUFFS EVERY 4 TO 6 HOURS AS NEEDED FOR SHORTNESS OF BREATH 07/02/20   Panosh, Standley Brooking, MD  diclofenac (VOLTAREN) 75 MG EC  tablet Take 75 mg by mouth 2 (two) times daily. 01/29/21   [provider]  doxycycline (DORYX) 100 MG EC tablet Take by mouth. 01/29/20   [provider]  MULTIPLE VITAMIN PO Take by mouth.    [provider]  Multiple Vitamins-Minerals (HAIR/SKIN/NAILS) TABS Hair, Skin and Nails-Argan Oil    [provider]  oxyCODONE-acetaminophen (PERCOCET/ROXICET) 5-325 MG tablet Take 1-2 tablets by mouth every 4 (four) hours as needed. 10/25/20   [provider]  Probiotic Product (PROBIOTIC FORMULA PO) Take by mouth. Patient not taking: No sig reported    [provider]    Current Outpatient  Medications  Medication Sig Dispense Refill   acetaminophen (TYLENOL) 500 MG tablet Tylenol Extra Strength 500 mg tablet   2 tablets every 4-6 hours by oral route.     amoxicillin (AMOXIL) 500 MG capsule Take 4 capsules by mouth daily as needed. With dental procedures     CALCIUM PO Take by mouth.     Cholecalciferol 25 MCG (1000 UT) capsule Take by mouth.     clobetasol ointment (TEMOVATE) 0.05 % Apply to the scalp x4-5 times weekly.     CONCERTA 18 MG CR tablet Take 18 mg by mouth daily.     cyclobenzaprine (FLEXERIL) 5 MG tablet Take 1 tablet by mouth 2 (two) times daily as needed.     raloxifene (EVISTA) 60 MG tablet Take 60 mg by mouth daily.     Tafluprost, PF, (ZIOPTAN) 0.0015 % SOLN Zioptan (PF) 0.0015 % eye drops in a dropperette     timolol (TIMOPTIC) 0.5 % ophthalmic solution Place 1 drop into both eyes daily.     albuterol (VENTOLIN HFA) 108 (90 Base) MCG/ACT inhaler USE 2 PUFFS EVERY 4 TO 6 HOURS AS NEEDED FOR SHORTNESS OF BREATH 18 g 2   diclofenac (VOLTAREN) 75 MG EC tablet Take 75 mg by mouth 2 (two) times daily.     doxycycline (DORYX) 100 MG EC tablet Take by mouth.     MULTIPLE VITAMIN PO Take by mouth.     Multiple Vitamins-Minerals (HAIR/SKIN/NAILS) TABS Hair, Skin and Nails-Argan Oil     oxyCODONE-acetaminophen (PERCOCET/ROXICET) 5-325 MG tablet Take 1-2 tablets by mouth every 4 (four) hours as needed.     Probiotic Product (PROBIOTIC FORMULA PO) Take by mouth. (Patient not taking: No sig reported)     Current Facility-Administered Medications  Medication Dose Route Frequency Provider Last Rate Last Admin   0.9 %  sodium chloride infusion  500 mL Intravenous Once Mauri Pole, MD        Allergies as of 02/19/2021   (No Known Allergies)    Family History  Problem Relation Age of Onset   Hypertension Mother        deceased   Alcohol abuse Mother    Diabetes Father        died from complications of dm, amputation and sepsis in his 64's    Colon polyps  Father    Hypertension Sister    Colon polyps Sister    Breast cancer Maternal Aunt    Colon polyps Other    Colon cancer Neg Hx     Social History   Socioeconomic History   Marital status: Married    Spouse name: Not on file   Number of children: Not on file   Years of education: Not on file   Highest education level: Not on file  Occupational History   Not on file  Tobacco  Use   Smoking status: Never   Smokeless tobacco: Never  Vaping Use   Vaping Use: Never used  Substance and Sexual Activity   Alcohol use: Yes    Alcohol/week: 0.0 standard drinks    Comment: socially   Drug use: No   Sexual activity: Not on file  Other Topics Concern   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    She does not smoke cigarettes or use recreational drugs.     She drinks alcohol on rare occasions with 1 glass of wine. Approx. Once a month.  She does not do regular exercise.   recently    HH of 3 -4hysband and 39 yo son  Has exchange student from [place] now    Pet poodle   Husband is a smoker   [name] is married with 3 children, ages  One fr college another on her own       hh of 4    Social Determinants of Radio broadcast assistant Strain: Not on file  Food Insecurity: Not on file  Transportation Needs: Not on file  Physical Activity: Not on file  Stress: Not on file  Social Connections: Not on file  Intimate Partner Violence: Not on file    Review of Systems:  All other review of systems negative except as mentioned in the HPI.  Physical Exam: Vital signs in last 24 hours: BP 138/76   Pulse 77   Temp 98.4 F (36.9 C)   Resp (!) 0   Ht 5' 7.5" (1.715 m)   Wt 212 lb (96.2 kg)   SpO2 97%   BMI 32.71 kg/m     General:   Alert, NAD Lungs:  Clear .   Heart:  Regular rate and rhythm Abdomen:  Soft,  nontender and nondistended. Neuro/Psych:  Alert and cooperative. Normal mood and affect. A and O x 3  Reviewed labs, radiology imaging, old records and pertinent past GI work up  Patient is appropriate for planned procedure(s) and anesthesia in an ambulatory setting   K. Denzil Magnuson , MD [phone]

## 2021-02-21 ENCOUNTER — Telehealth: Payer: Self-pay | Admitting: *Deleted

## 2021-02-21 NOTE — Telephone Encounter (Signed)
  Follow up Call-  Call back number 02/19/2021  Post procedure Call Back phone  # [phone]  Permission to leave phone message Yes  Some recent data might be hidden     Patient questions:  Do you have a fever, pain , or abdominal swelling? No. Pain Score  0 *  Have you tolerated food without any problems? Yes.    Have you been able to return to your normal activities? Yes.    Do you have any questions about your discharge instructions: Diet   No. Medications  No. Follow up visit  No.  Do you have questions or concerns about your Care? No.  Actions: * If pain score is 4 or above: No action needed, pain <4.

## 2021-03-04 ENCOUNTER — Encounter: Payer: Self-pay | Admitting: Gastroenterology

## 2021-04-22 ENCOUNTER — Ambulatory Visit: Payer: 59 | Admitting: Gastroenterology

## 2021-06-26 ENCOUNTER — Ambulatory Visit: Payer: 59 | Admitting: Gastroenterology

## 2021-06-26 ENCOUNTER — Encounter: Payer: Self-pay | Admitting: Gastroenterology

## 2021-06-26 VITALS — BP 94/60 | HR 60 | Ht 66.75 in | Wt 218.0 lb

## 2021-06-26 DIAGNOSIS — K641 Second degree hemorrhoids: Secondary | ICD-10-CM

## 2021-06-26 NOTE — Progress Notes (Signed)
PROCEDURE NOTE: The patient presents with symptomatic grade II  hemorrhoids, requesting rubber band ligation of his/her hemorrhoidal disease.  All risks, benefits and alternative forms of therapy were described and informed consent was obtained.  In the Left Lateral Decubitus position anoscopic examination revealed grade II hemorrhoids in the right posterior, left lateral and right anterior position(s).  The anorectum was pre-medicated with 0.125% nitroglycerin and RectiCare The decision was made to band the Right posterior internal hemorrhoid, and the [place] was used to perform band ligation without complication.  Digital anorectal examination was then performed to assure proper positioning of the band, and to adjust the banded tissue as required.  The patient was discharged home without pain or other issues.  Dietary and behavioral recommendations were given and along with follow-up instructions.     The following adjunctive treatments were recommended: Benefiber 1 tablespoon 2-3 times daily with meals Use small pea-sized amount of RectiCare per rectum as needed twice daily for the next 3 to 4 days.  The patient will return in 2-4 weeks for  follow-up and possible additional banding as required. No complications were encountered and the patient tolerated the procedure well.  Damaris Hippo , MD [phone]

## 2021-06-26 NOTE — Patient Instructions (Addendum)
If you are age 61 or older, your body mass index should be between 23-30. Your Body mass index is 34.4 kg/m. If this is out of the aforementioned range listed, please consider follow up with your Primary Care Provider.  If you are age 61 or younger, your body mass index should be between 19-25. Your Body mass index is 34.4 kg/m. If this is out of the aformentioned range listed, please consider follow up with your Primary Care Provider.   ________________________________________________________  The [facility] providers would like to encourage you to use [facility] to communicate with providers for non-urgent requests or questions.  Due to long hold times on the telephone, sending your provider a message by [facility] may be a faster and more efficient way to get a response.  Please allow 48 business hours for a response.  Please remember that this is for non-urgent requests.  _______________________________________________________    Take benefiber1 tablespoon three times a day with meals     HEMORRHOID BANDING PROCEDURE    FOLLOW-UP CARE   The procedure you have had should have been relatively painless since the banding of the area involved does not have nerve endings and there is no pain sensation.  The rubber band cuts off the blood supply to the hemorrhoid and the band may fall off as soon as 48 hours after the banding (the band may occasionally be seen in the toilet bowl following a bowel movement). You may notice a temporary feeling of fullness in the rectum which should respond adequately to plain Tylenol or Motrin.  Following the banding, avoid strenuous exercise that evening and resume full activity the next day.  A sitz bath (soaking in a warm tub) or bidet is soothing, and can be useful for cleansing the area after bowel movements.     To avoid constipation, take two tablespoons of natural wheat bran, natural oat bran, flax, Benefiber or any over the counter fiber supplement and  increase your water intake to 7-8 glasses daily.    Unless you have been prescribed anorectal medication, do not put anything inside your rectum for two weeks: No suppositories, enemas, fingers, etc.  Occasionally, you may have more bleeding than usual after the banding procedure.  This is often from the untreated hemorrhoids rather than the treated one.  Dont be concerned if there is a tablespoon or so of blood.  If there is more blood than this, lie flat with your bottom higher than your head and apply an ice pack to the area. If the bleeding does not stop within a half an hour or if you feel faint, call our office at [phone] or go to the emergency room.  Problems are not common; however, if there is a substantial amount of bleeding, severe pain, chills, fever or difficulty passing urine (very rare) or other problems, you should call us at [phone] or report to the nearest emergency room.  Do not stay seated continuously for more than 2-3 hours for a day or two after the procedure.  Tighten your buttock muscles 10-15 times every two hours and take 10-15 deep breaths every 1-2 hours.  Do not spend more than a few minutes on the toilet if you cannot empty your bowel; instead re-visit the toilet at a later time.     I appreciate the  opportunity to care for you  Thank You   Harl Bowie , MD

## 2021-07-28 LAB — HM DEXA SCAN: HM Dexa Scan: NORMAL

## 2021-07-28 LAB — HM MAMMOGRAPHY

## 2021-07-31 ENCOUNTER — Encounter: Payer: Self-pay | Admitting: Internal Medicine

## 2021-08-01 ENCOUNTER — Ambulatory Visit: Payer: 59 | Admitting: Gastroenterology

## 2021-08-01 ENCOUNTER — Encounter: Payer: Self-pay | Admitting: Gastroenterology

## 2021-08-01 VITALS — BP 120/74 | HR 72 | Ht 67.5 in | Wt 221.0 lb

## 2021-08-01 DIAGNOSIS — K641 Second degree hemorrhoids: Secondary | ICD-10-CM | POA: Diagnosis not present

## 2021-08-01 NOTE — Patient Instructions (Signed)
HEMORRHOID BANDING PROCEDURE  ? ? FOLLOW-UP CARE ? ? ?The procedure you have had should have been relatively painless since the banding of the area involved does not have nerve endings and there is no pain sensation.  The rubber band cuts off the blood supply to the hemorrhoid and the band may fall off as soon as 48 hours after the banding (the band may occasionally be seen in the toilet bowl following a bowel movement). You may notice a temporary feeling of fullness in the rectum which should respond adequately to plain Tylenol? or Motrin?. ? ?Following the banding, avoid strenuous exercise that evening and resume full activity the next day.  A sitz bath (soaking in a warm tub) or bidet is soothing, and can be useful for cleansing the area after bowel movements.   ? ? ?To avoid constipation, take two tablespoons of natural wheat bran, natural oat bran, flax, Benefiber? or any over the counter fiber supplement and increase your water intake to 7-8 glasses daily.   ? ?Unless you have been prescribed anorectal medication, do not put anything inside your rectum for two weeks: No suppositories, enemas, fingers, etc. ? ?Occasionally, you may have more bleeding than usual after the banding procedure.  This is often from the untreated hemorrhoids rather than the treated one.  Don?t be concerned if there is a tablespoon or so of blood.  If there is more blood than this, lie flat with your bottom higher than your head and apply an ice pack to the area. If the bleeding does not stop within a half an hour or if you feel faint, call our office at [phone] or go to the emergency room. ? ?Problems are not common; however, if there is a substantial amount of bleeding, severe pain, chills, fever or difficulty passing urine (very rare) or other problems, you should call us at [phone] or report to the nearest emergency room. ? ?Do not stay seated continuously for more than 2-3 hours for a day or two after the procedure.   Tighten your buttock muscles 10-15 times every two hours and take 10-15 deep breaths every 1-2 hours.  Do not spend more than a few minutes on the toilet if you cannot empty your bowel; instead re-visit the toilet at a later time. ? ? I appreciate the  opportunity to care for you ? ?Thank You  ? ?Kavitha Nandigam , MD  ?

## 2021-08-01 NOTE — Progress Notes (Signed)
PROCEDURE NOTE: The patient presents with symptomatic grade II  hemorrhoids, requesting rubber band ligation of his/her hemorrhoidal disease.  All risks, benefits and alternative forms of therapy were described and informed consent was obtained.   The anorectum was pre-medicated with 0.125% NTG and Recticare The decision was made to band the left lateral internal hemorrhoid, and the [place] was used to perform band ligation without complication.  Digital anorectal examination was then performed to assure proper positioning of the band, and to adjust the banded tissue as required.  The patient was discharged home without pain or other issues.  Dietary and behavioral recommendations were given and along with follow-up instructions.     The patient will return in 2-4 weeks for  follow-up and possible additional banding as required. No complications were encountered and the patient tolerated the procedure well.  Damaris Hippo , MD [phone]

## 2021-08-19 ENCOUNTER — Ambulatory Visit: Payer: 59 | Admitting: Gastroenterology

## 2021-08-19 ENCOUNTER — Telehealth: Payer: Self-pay

## 2021-08-19 ENCOUNTER — Encounter: Payer: Self-pay | Admitting: Gastroenterology

## 2021-08-19 VITALS — BP 130/70 | HR 62 | Ht 67.5 in | Wt 221.0 lb

## 2021-08-19 DIAGNOSIS — K641 Second degree hemorrhoids: Secondary | ICD-10-CM | POA: Diagnosis not present

## 2021-08-19 NOTE — Telephone Encounter (Signed)
Opened in error

## 2021-08-19 NOTE — Progress Notes (Signed)
PROCEDURE NOTE: The patient presents with symptomatic grade II  hemorrhoids, requesting rubber band ligation of his/her hemorrhoidal disease.  All risks, benefits and alternative forms of therapy were described and informed consent was obtained.   The anorectum was pre-medicated with 0.125% NTG and Recticare The decision was made to band the right anterior internal hemorrhoid, and the [place] was used to perform band ligation without complication.  Digital anorectal examination was then performed to assure proper positioning of the band, and to adjust the banded tissue as required.  The patient was discharged home without pain or other issues.  Dietary and behavioral recommendations were given and along with follow-up instructions.      The patient will return as needed for follow-up No complications were encountered and the patient tolerated the procedure well.   Damaris Hippo , MD [phone]

## 2021-08-19 NOTE — Patient Instructions (Signed)

## 2021-12-30 NOTE — Progress Notes (Unsigned)
No chief complaint on file.   HPI: Patient  Hannah [name]  61 y.o. comes in today for Preventive Health Care visit   Health Maintenance  Topic Date Due   PAP SMEAR-Modifier  03/23/2019   COVID-19 Vaccine (4 - Booster for [place] series) 11/07/2020   INFLUENZA VACCINE  01/20/2022   MAMMOGRAM  07/29/2023   COLONOSCOPY (Pts 45-19yr Insurance coverage will need to be confirmed)  02/20/2024   TETANUS/TDAP  08/10/2024   Hepatitis C Screening  Completed   HIV Screening  Completed   Zoster Vaccines- Shingrix  Completed   HPV VACCINES  Aged Out   Health Maintenance Review LIFESTYLE:  Exercise:   Tobacco/ETS: Alcohol:  Sugar beverages: Sleep: Drug use: no HH of  Work:    ROS:  REST of 12 system review negative except as per HPI   Past Medical History:  Diagnosis Date   Asthma    with MDI   Chest pain, atypical    CHEST PAIN-PRECORDIAL 10/26/2008   Qualifier: Diagnosis of  By: CBurt Knack MD, MClayburn Pert   Chronic constipation    [place]ometrial cancer [place]   Glaucoma    H/O: hysterectomy 06/22/2009   presented with irregular bleeding   ICP (infantile cerebral palsy) (HCC)    elevated   Iron deficiency anemia    MENOPAUSE, SURGICAL 01/06/2010   Qualifier: Diagnosis of  By: PRegis BillMD, WStandley Brooking   NEOPLASM, MALIGNANT, UTERUS, HX OF 01/06/2010   Qualifier: Diagnosis of  By: PRegis BillMD, WStandley Brooking   Nevus, atypical    Osteochondroma    OSTEOCHONDROMA 10/10/2008   Qualifier: Diagnosis of  By: JRonnald RampCMA, Chemira     Rhinitis    Seasonal allergies    Uterine cancer ([facility] 2011    Past Surgical History:  Procedure Laterality Date   CESAREAN SECTION     COLONOSCOPY  2014   DB-MAC-mov(fair)-normal redundant/tort colon -couldnt view cecal pouch-7-10 yr recall   FOOT SURGERY Bilateral    MENISCUS REPAIR Left 01/2019   MYOMECTOMY     SHOULDER ARTHROSCOPY WITH OPEN ROTATOR CUFF REPAIR Left    TOTAL KNEE ARTHROPLASTY Left 2022   TOTAL VAGINAL HYSTERECTOMY  2011    TUBAL LIGATION     UMBILICAL HERNIA REPAIR     WISDOM TOOTH EXTRACTION      Family History  Problem Relation Age of Onset   Hypertension Mother    Alcohol abuse Mother    Diabetes Father        died from complications of dm, amputation and sepsis in his 843's   Colon polyps Father    Hypertension Sister    Colon polyps Sister    Breast cancer Maternal Aunt    Colon polyps Other    Colon cancer Neg Hx    Esophageal cancer Neg Hx    Pancreatic cancer Neg Hx    Liver disease Neg Hx     Social History   Socioeconomic History   Marital status: Married    Spouse name: Not on file   Number of children: 3   Years of education: Not on file   Highest education level: Not on file  Occupational History   Occupation: PEngineer, structural Tobacco Use   Smoking status: Never   Smokeless tobacco: Never  Vaping Use   Vaping Use: Never used  Substance and Sexual Activity   Alcohol use: Yes    Alcohol/week: 0.0 standard drinks of alcohol    Comment: socially  Drug use: No   Sexual activity: Not on file  Other Topics Concern   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    She does not smoke cigarettes or use recreational drugs.     She drinks alcohol on rare occasions with 1 glass of wine. Approx. Once a month.  She does not do regular exercise.   recently    HH of 3 -4hysband and 29 yo son  Has exchange student from [place] now    Pet poodle   Husband is a smoker   [name] is married with 3 children, ages  One fr college another on her own       hh of 4    Social Determinants of Radio broadcast assistant Strain: Not on file  Food Insecurity: Not on file  Transportation Needs: Not on file  Physical Activity: Not on file  Stress: Not on file  Social Connections: Not on file    Outpatient Medications Prior to  Visit  Medication Sig Dispense Refill   acetaminophen (TYLENOL) 500 MG tablet Tylenol Extra Strength 500 mg tablet   2 tablets every 4-6 hours by oral route.     albuterol (VENTOLIN HFA) 108 (90 Base) MCG/ACT inhaler USE 2 PUFFS EVERY 4 TO 6 HOURS AS NEEDED FOR SHORTNESS OF BREATH 18 g 2   CALCIUM PO Take by mouth.     CONCERTA 18 MG CR tablet Take 18 mg by mouth daily.     Minoxidil (ROGAINE MENS) 5 % FOAM See admin instructions.     MULTIPLE VITAMIN PO Take by mouth.     Multiple Vitamins-Minerals (HAIR/SKIN/NAILS) TABS Hair, Skin and Nails-Argan Oil     Omega-3 Fatty Acids (FISH OIL PO) Take 1 tablet by mouth daily.     pregabalin (LYRICA) 75 MG capsule Take 75 mg by mouth 2 (two) times daily.     Probiotic Product (PROBIOTIC FORMULA PO) Take by mouth.     raloxifene (EVISTA) 60 MG tablet Take 60 mg by mouth daily.     Tafluprost, PF, (ZIOPTAN) 0.0015 % SOLN Zioptan (PF) 0.0015 % eye drops in a dropperette     timolol (TIMOPTIC) 0.5 % ophthalmic solution Place 1 drop into both eyes daily.     No facility-administered medications prior to visit.     EXAM:  There were no vitals taken for this visit.  There is no height or weight on file to calculate BMI. Wt Readings from Last 3 Encounters:  08/19/21 221 lb (100.2 kg)  08/01/21 221 lb (100.2 kg)  06/26/21 218 lb (98.9 kg)    Physical Exam: Vital signs reviewed VHQ:IONG is a well-developed well-nourished alert cooperative    who appearsr stated age in no acute distress.  HEENT: normocephalic atraumatic , Eyes: PERRL EOM's full, conjunctiva clear, Nares: paten,t no deformity discharge or tenderness., Ears: no deformity EAC's clear TMs with normal landmarks. Mouth: clear OP, no lesions, edema.  Moist mucous membranes. Dentition in adequate repair. NECK: supple without masses, thyromegaly or bruits. CHEST/PULM:  Clear to auscultation and percussion breath sounds equal no wheeze , rales or rhonchi. No chest wall deformities or  tenderness. Breast: normal by inspection . No dimpling, discharge, masses, tenderness or discharge . CV: PMI is nondisplaced, S1 S2 no gallops,  murmurs, rubs. Peripheral pulses are full without delay.No JVD .  ABDOMEN: Bowel sounds normal nontender  No guard or rebound, no hepato splenomegal no CVA tenderness.  No hernia. Extremtities:  No clubbing cyanosis or edema, no acute joint swelling or redness no focal atrophy NEURO:  Oriented x3, cranial nerves 3-12 appear to be intact, no obvious focal weakness,gait within normal limits no abnormal reflexes or asymmetrical SKIN: No acute rashes normal turgor, color, no bruising or petechiae. PSYCH: Oriented, good eye contact, no obvious depression anxiety, cognition and judgment appear normal. LN: no cervical axillary inguinal adenopathy  Lab Results  Component Value Date   WBC 6.3 12/30/2020   HGB 12.8 12/30/2020   HCT 38.8 12/30/2020   PLT 271.0 12/30/2020   GLUCOSE 95 12/30/2020   CHOL 202 (H) 12/30/2020   TRIG 76.0 12/30/2020   HDL 47.50 12/30/2020   LDLDIRECT 137.9 10/10/2008   LDLCALC 139 (H) 12/30/2020   ALT 10 12/30/2020   AST 12 12/30/2020   NA 138 12/30/2020   K 4.3 12/30/2020   CL 104 12/30/2020   CREATININE 0.95 12/30/2020   BUN 17 12/30/2020   CO2 28 12/30/2020   TSH 1.64 12/30/2020   HGBA1C 5.9 12/30/2020    BP Readings from Last 3 Encounters:  08/19/21 130/70  08/01/21 120/74  06/26/21 94/60    Lab results reviewed with patient   ASSESSMENT AND PLAN:  Discussed the following assessment and plan:  No diagnosis found. No follow-ups on file.  Patient Care Team: Beverley Sherrard, Standley Brooking, MD as PCP - General Servando Salina, MD (Obstetrics and Gynecology) Sherren Mocha, MD (Cardiology) Gaynelle Arabian, MD as Consulting Physician (Orthopedic Surgery) There are no Patient Instructions on file for this visit.  Standley Brooking. Sonoma Firkus M.D.

## 2021-12-31 ENCOUNTER — Encounter: Payer: Self-pay | Admitting: Internal Medicine

## 2021-12-31 ENCOUNTER — Ambulatory Visit (INDEPENDENT_AMBULATORY_CARE_PROVIDER_SITE_OTHER): Payer: 59 | Admitting: Internal Medicine

## 2021-12-31 VITALS — BP 116/78 | HR 70 | Temp 98.7°F | Ht 68.0 in | Wt 208.6 lb

## 2021-12-31 DIAGNOSIS — Z79899 Other long term (current) drug therapy: Secondary | ICD-10-CM | POA: Diagnosis not present

## 2021-12-31 DIAGNOSIS — Z0000 Encounter for general adult medical examination without abnormal findings: Secondary | ICD-10-CM | POA: Diagnosis not present

## 2021-12-31 DIAGNOSIS — E7800 Pure hypercholesterolemia, unspecified: Secondary | ICD-10-CM

## 2021-12-31 DIAGNOSIS — R739 Hyperglycemia, unspecified: Secondary | ICD-10-CM | POA: Diagnosis not present

## 2021-12-31 DIAGNOSIS — Z6831 Body mass index (BMI) 31.0-31.9, adult: Secondary | ICD-10-CM

## 2021-12-31 LAB — BASIC METABOLIC PANEL
BUN: 18 mg/dL (ref 6–23)
CO2: 30 mEq/L (ref 19–32)
Calcium: 9.8 mg/dL (ref 8.4–10.5)
Chloride: 104 mEq/L (ref 96–112)
Creatinine, Ser: 0.98 mg/dL (ref 0.40–1.20)
GFR: 62.48 mL/min (ref >60.00)
Glucose, Bld: 88 mg/dL (ref 70–99)
Potassium: 4 mEq/L (ref 3.5–5.1)
Sodium: 139 mEq/L (ref 135–145)

## 2021-12-31 LAB — CBC WITH DIFFERENTIAL/PLATELET
Basophils Absolute: 0 10*3/uL (ref 0.0–0.1)
Basophils Relative: 0.3 % (ref 0.0–3.0)
Eosinophils Absolute: 0.1 10*3/uL (ref 0.0–0.7)
Eosinophils Relative: 2 % (ref 0.0–5.0)
HCT: 43.2 % (ref 36.0–46.0)
Hemoglobin: 14.1 g/dL (ref 12.0–15.0)
Lymphocytes Relative: 39.1 % (ref 12.0–46.0)
Lymphs Abs: 2.5 10*3/uL (ref 0.7–4.0)
MCHC: 32.6 g/dL (ref 30.0–36.0)
MCV: 86.3 fl (ref 78.0–100.0)
Monocytes Absolute: 0.6 10*3/uL (ref 0.1–1.0)
Monocytes Relative: 8.9 % (ref 3.0–12.0)
Neutro Abs: 3.2 10*3/uL (ref 1.4–7.7)
Neutrophils Relative %: 49.7 % (ref 43.0–77.0)
Platelets: 263 10*3/uL (ref 150.0–400.0)
RBC: 5 Mil/uL (ref 3.87–5.11)
RDW: 14.4 % (ref 11.5–15.5)
WBC: 6.5 10*3/uL (ref 4.0–10.5)

## 2021-12-31 LAB — LIPID PANEL
Cholesterol: 190 mg/dL (ref 0–200)
HDL: 50.7 mg/dL (ref >39.00)
LDL Cholesterol: 125 mg/dL — ABNORMAL HIGH (ref 0–99)
NonHDL: 139.21
Total CHOL/HDL Ratio: 4
Triglycerides: 71 mg/dL (ref 0.0–149.0)
VLDL: 14.2 mg/dL (ref 0.0–40.0)

## 2021-12-31 LAB — HEPATIC FUNCTION PANEL
ALT: 14 U/L (ref 0–35)
AST: 18 U/L (ref 0–37)
Albumin: 4.4 g/dL (ref 3.5–5.2)
Alkaline Phosphatase: 59 U/L (ref 39–117)
Bilirubin, Direct: 0.1 mg/dL (ref 0.0–0.3)
Total Bilirubin: 0.4 mg/dL (ref 0.2–1.2)
Total Protein: 7.5 g/dL (ref 6.0–8.3)

## 2021-12-31 LAB — HEMOGLOBIN A1C: Hgb A1c MFr Bld: 6 % (ref 4.6–6.5)

## 2021-12-31 LAB — TSH: TSH: 1.14 u[IU]/mL (ref 0.35–5.50)

## 2021-12-31 NOTE — Patient Instructions (Signed)
Good to see you today. Labs as ALT will be on MyChart Let us know if you wish [place] to refer to weight management. Also consider coronary calcium score to assess risk consideration of adding a statin medicine if cholesterol is elevated ,$99 self-pay  Healthy eating avoiding processed foods simple sugars, increasing fiber fruits and vegetables are heart healthy and probably cancer prevention healthy.  Flu vaccine in the fall.

## 2022-01-01 NOTE — Progress Notes (Signed)
Cholesterol is improved but ldl would be best to be below 100  10 years risk is still low   blood sugar is nl a1c in pre diabetic area range .  Rest of labs normal .   Intensify lifestyle interventions.  Let us know if want referral to weight management ,  and or  do coronary scan 99$ to get more info for risk category and consider statin medicatin.  The 10-year ASCVD risk score (Arnett DK, et al., 2019) is: 4.3%   Values used to calculate the score:     Age: 61 years     Sex: Female     Is Non-Hispanic African American: Yes     Diabetic: No     Tobacco smoker: No     Systolic Blood Pressure: 481 mmHg     Is BP treated: No     HDL Cholesterol: 50.7 mg/dL     Total Cholesterol: 190 mg/dL

## 2022-01-08 NOTE — Progress Notes (Signed)
Suggest  ct calcium score to help define risk further ( if zero then can continue lsi if hich  consider medication)

## 2022-08-06 ENCOUNTER — Encounter: Payer: Self-pay | Admitting: Internal Medicine

## 2022-08-07 ENCOUNTER — Other Ambulatory Visit: Payer: Self-pay

## 2022-08-07 DIAGNOSIS — J45909 Unspecified asthma, uncomplicated: Secondary | ICD-10-CM

## 2022-08-07 MED ORDER — ALBUTEROL SULFATE HFA 108 (90 BASE) MCG/ACT IN AERS: INHALATION_SPRAY | RESPIRATORY_TRACT | 0 refills | Status: DC

## 2022-08-29 ENCOUNTER — Emergency Department (HOSPITAL_BASED_OUTPATIENT_CLINIC_OR_DEPARTMENT_OTHER): Payer: 59

## 2022-08-29 ENCOUNTER — Emergency Department (HOSPITAL_BASED_OUTPATIENT_CLINIC_OR_DEPARTMENT_OTHER)
Admission: EM | Admit: 2022-08-29 | Discharge: 2022-08-29 | Disposition: A | Discharge status: Home / Self Care | Payer: 59 | Attending: Emergency Medicine | Admitting: Emergency Medicine

## 2022-08-29 ENCOUNTER — Encounter (HOSPITAL_BASED_OUTPATIENT_CLINIC_OR_DEPARTMENT_OTHER): Payer: Self-pay | Admitting: Emergency Medicine

## 2022-08-29 DIAGNOSIS — R079 Chest pain, unspecified: Secondary | ICD-10-CM | POA: Insufficient documentation

## 2022-08-29 DIAGNOSIS — Z1152 Encounter for screening for COVID-19: Secondary | ICD-10-CM | POA: Diagnosis not present

## 2022-08-29 LAB — COMPREHENSIVE METABOLIC PANEL
ALT: 10 U/L (ref 0–44)
AST: 13 U/L — ABNORMAL LOW (ref 15–41)
Albumin: 4.2 g/dL (ref 3.5–5.0)
Alkaline Phosphatase: 63 U/L (ref 38–126)
Anion gap: 7 (ref 5–15)
BUN: 15 mg/dL (ref 8–23)
CO2: 27 mmol/L (ref 22–32)
Calcium: 10.1 mg/dL (ref 8.9–10.3)
Chloride: 107 mmol/L (ref 98–111)
Creatinine, Ser: 1.03 mg/dL — ABNORMAL HIGH (ref 0.44–1.00)
GFR, Estimated: >60 mL/min (ref >60)
Glucose, Bld: 96 mg/dL (ref 70–99)
Potassium: 3.8 mmol/L (ref 3.5–5.1)
Sodium: 141 mmol/L (ref 135–145)
Total Bilirubin: 0.3 mg/dL (ref 0.3–1.2)
Total Protein: 7.8 g/dL (ref 6.5–8.1)

## 2022-08-29 LAB — CBC WITH DIFFERENTIAL/PLATELET
Abs Immature Granulocytes: 0.02 10*3/uL (ref 0.00–0.07)
Basophils Absolute: 0 10*3/uL (ref 0.0–0.1)
Basophils Relative: 0 %
Eosinophils Absolute: 0.3 10*3/uL (ref 0.0–0.5)
Eosinophils Relative: 3 %
HCT: 42.5 % (ref 36.0–46.0)
Hemoglobin: 14 g/dL (ref 12.0–15.0)
Immature Granulocytes: 0 %
Lymphocytes Relative: 38 %
Lymphs Abs: 3.6 10*3/uL (ref 0.7–4.0)
MCH: 27.9 pg (ref 26.0–34.0)
MCHC: 32.9 g/dL (ref 30.0–36.0)
MCV: 84.8 fL (ref 80.0–100.0)
Monocytes Absolute: 0.9 10*3/uL (ref 0.1–1.0)
Monocytes Relative: 10 %
Neutro Abs: 4.6 10*3/uL (ref 1.7–7.7)
Neutrophils Relative %: 49 %
Platelets: 299 10*3/uL (ref 150–400)
RBC: 5.01 MIL/uL (ref 3.87–5.11)
RDW: 13.7 % (ref 11.5–15.5)
WBC: 9.4 10*3/uL (ref 4.0–10.5)
nRBC: 0 % (ref 0.0–0.2)

## 2022-08-29 LAB — RESP PANEL BY RT-PCR (RSV, FLU A&B, COVID)  RVPGX2
Influenza A by PCR: NEGATIVE
Influenza B by PCR: NEGATIVE
Resp Syncytial Virus by PCR: NEGATIVE
SARS Coronavirus 2 by RT PCR: NEGATIVE

## 2022-08-29 LAB — TROPONIN I (HIGH SENSITIVITY): Troponin I (High Sensitivity): <2 ng/L (ref <18)

## 2022-08-29 LAB — LIPASE, BLOOD: Lipase: 42 U/L (ref 11–51)

## 2022-08-29 MED ORDER — OXYCODONE HCL 5 MG PO TABS
5.0000 mg | ORAL_TABLET | Freq: Once | ORAL | Status: AC
  Administered 2022-08-29: 5 mg via ORAL
  Filled 2022-08-29: qty 1

## 2022-08-29 MED ORDER — OMEPRAZOLE 20 MG PO CPDR: 20.0000 mg | DELAYED_RELEASE_CAPSULE | Freq: Every day | ORAL | 0 refills | Status: DC

## 2022-08-29 MED ORDER — CYCLOBENZAPRINE HCL 10 MG PO TABS
10.0000 mg | ORAL_TABLET | Freq: Once | ORAL | Status: AC
  Administered 2022-08-29: 10 mg via ORAL
  Filled 2022-08-29: qty 1

## 2022-08-29 MED ORDER — CYCLOBENZAPRINE HCL 10 MG PO TABS: 10.0000 mg | ORAL_TABLET | Freq: Every day | ORAL | 0 refills | Status: DC

## 2022-08-29 MED ORDER — ACETAMINOPHEN 500 MG PO TABS: 1000.0000 mg | ORAL_TABLET | Freq: Three times a day (TID) | ORAL | 0 refills | Status: DC | PRN

## 2022-08-29 MED ORDER — IBUPROFEN 400 MG PO TABS: 400.0000 mg | ORAL_TABLET | Freq: Four times a day (QID) | ORAL | 0 refills | Status: DC | PRN

## 2022-08-29 MED ORDER — IOHEXOL 350 MG/ML SOLN
100.0000 mL | Freq: Once | INTRAVENOUS | Status: AC | PRN
  Administered 2022-08-29: 80 mL via INTRAVENOUS

## 2022-08-29 NOTE — ED Provider Notes (Signed)
[facility] Provider Note   CSN: [id number] Arrival date & time: 08/29/22  2144     History Chief Complaint  Patient presents with   Abdominal Pain   Chest Pain    HPI Hannah [name] is a 62 y.o. female presenting for chief complaint of chest pain.  She is 62 year old female with 3 weeks of chest pain.  States that it is right-sided in nature intermittent.  She has a follow-up with her PCP next week.  Denies fevers chills nausea vomiting syncope or shortness of breath.  Otherwise ambulatory tolerating p.o. intake..   Patient's recorded medical, surgical, social, medication list and allergies were reviewed in the Snapshot window as part of the initial history.   Review of Systems   Review of Systems  Constitutional:  Negative for chills and fever.  HENT:  Negative for ear pain and sore throat.   Eyes:  Negative for pain and visual disturbance.  Respiratory:  Negative for cough and shortness of breath.   Cardiovascular:  Positive for chest pain. Negative for palpitations.  Gastrointestinal:  Negative for abdominal pain and vomiting.  Genitourinary:  Negative for dysuria and hematuria.  Musculoskeletal:  Negative for arthralgias and back pain.  Skin:  Negative for color change and rash.  Neurological:  Negative for seizures and syncope.  All other systems reviewed and are negative.   Physical Exam Updated Vital Signs BP (!) 146/86   Pulse 91   Temp 97.9 F (36.6 C) (Oral)   Resp 15   SpO2 99%  Physical Exam Vitals and nursing note reviewed.  Constitutional:      General: She is not in acute distress.    Appearance: She is well-developed.  HENT:     Head: Normocephalic and atraumatic.  Eyes:     Conjunctiva/sclera: Conjunctivae normal.  Cardiovascular:     Rate and Rhythm: Normal rate and regular rhythm.     Heart sounds: No murmur heard. Pulmonary:     Effort: Pulmonary effort is normal. No respiratory distress.      Breath sounds: Normal breath sounds.  Abdominal:     General: There is no distension.     Palpations: Abdomen is soft.     Tenderness: There is no abdominal tenderness. There is no right CVA tenderness or left CVA tenderness.  Musculoskeletal:        General: No swelling or tenderness. Normal range of motion.     Cervical back: Neck supple.  Skin:    General: Skin is warm and dry.  Neurological:     General: No focal deficit present.     Mental Status: She is alert and oriented to person, place, and time. Mental status is at baseline.     Cranial Nerves: No cranial nerve deficit.      ED Course/ Medical Decision Making/ A&P    Procedures Procedures   Medications Ordered in ED Medications  cyclobenzaprine (FLEXERIL) tablet 10 mg (has no administration in time range)  oxyCODONE (Oxy IR/ROXICODONE) immediate release tablet 5 mg (5 mg Oral Given 08/29/22 2206)  iohexol (OMNIPAQUE) 350 MG/ML injection 100 mL (80 mLs Intravenous Contrast Given 08/29/22 2210)   Medical Decision Making: Hannah [name] is a 62 y.o. female who presented to the ED today with chest pain, detailed above.  Based on patient's comorbidities, patient has a heart score of 2.    Patient's presentation is complicated by their history of advanced age.  Patient placed on continuous vitals and telemetry  monitoring while in ED which was reviewed periodically.  Complete initial physical exam performed, notably the patient was hemodynamically stable in no acute distress.   Reviewed and confirmed nursing documentation for past medical history, family history, social history.    Initial Assessment: With the patient's presentation of left-sided chest pain, most likely diagnosis is musculoskeletal chest pain versus GERD, although ACS remains on the differential. Other diagnoses were considered including (but not limited to) pulmonary embolism, community-acquired pneumonia, aortic dissection, pneumothorax, underlying  bony abnormality, anemia. These are considered less likely due to history of present illness and physical exam findings.    In particular, concerning pulmonary embolism: They endorse a history of malignancy, and they deny recent surgery, history of DVT, or calf tenderness leading to a moderate risk Wells score. Aortic Dissection also reconsidered but seems less likely based on the location, quality, onset, and severity of symptoms in this case. Patient has a lack of serious comorbidities for this condition including a lack of HTN or Smoking. Patient also has a lack of underlying history of AD or TAA.  This is most consistent with an acute life/limb threatening illness complicated by underlying chronic conditions.   Initial Plan: Evaluate for ACS with single troponin and EKG evaluated as below  Evaluate for dissection, bony abnormality, or pneumonia with chest x-ray and screening laboratory evaluation including CBC, BMP  Further evaluation for pulmonary embolism indicated at this time based on patient's PERC and Wells score.  Given moderate risk Wells, will proceed with CTA PE study Further evaluation for Thoracic Aortic Dissection not indicated at this time based on patient's clinical history and PE findings.   Initial Study Results: EKG was reviewed independently. Rate, rhythm, axis, intervals all examined and without medically relevant abnormality. ST segments without concerns for elevations.    Laboratory  Single troponin demonstrated no acute abnormality  CBC and BMP without obvious metabolic or inflammatory abnormalities requiring further evaluation   Radiology  CT Angio Chest Pulmonary Embolism (PE) W or WO Contrast  Result Date: 08/29/2022 CLINICAL DATA:  Upper abdominal and chest pain for 2 weeks. Pain is getting worse. PE suspected EXAM: CT ANGIOGRAPHY CHEST WITH CONTRAST TECHNIQUE: Multidetector CT imaging of the chest was performed using the standard protocol during bolus  administration of intravenous contrast. Multiplanar CT image reconstructions and MIPs were obtained to evaluate the vascular anatomy. RADIATION DOSE REDUCTION: This exam was performed according to the departmental dose-optimization program which includes automated exposure control, adjustment of the mA and/or kV according to patient size and/or use of iterative reconstruction technique. CONTRAST:  70m OMNIPAQUE IOHEXOL 350 MG/ML SOLN COMPARISON:  Radiographs 07/02/2020 and coronary CT 11/06/2008 FINDINGS: Cardiovascular: Satisfactory opacification of the pulmonary arteries to the segmental level. No evidence of pulmonary embolism. Normal heart size. No pericardial effusion. Aortic calcification. Mediastinum/Nodes: No enlarged mediastinal, hilar, or axillary lymph nodes. Thyroid gland, trachea, and esophagus demonstrate no significant findings. Lungs/Pleura: Lungs are clear. No pleural effusion or pneumothorax. Upper Abdomen: No acute abnormality. Musculoskeletal: No chest wall abnormality. No acute osseous findings. Review of the MIP images confirms the above findings. IMPRESSION: No evidence of pulmonary embolism or acute abnormality in the chest. Aortic Atherosclerosis (ICD10-I70.0). Electronically Signed   By: TPlacido SouM.D.   On: 08/29/2022 22:25    Final Assessment and Plan: Patient administered medications in emergency room and reassess after observation period.  Symptoms have completely resolved. Objective evaluation reveals no acute pathology, troponin PE study both negative.  Given duration of symptoms, delta troponin  not indicated at this time.  Given resolution of symptoms, patient stable for outpatient care management.  Heart score 2 and appointment has already been made with primary care provider for later this week.  Will trial musculoskeletal and gastroesophageal treatment options for supportive care in the outpatient setting otherwise stable for outpatient care and  management. Disposition:  I have considered need for hospitalization, however, considering all of the above, I believe this patient is stable for discharge at this time.  Patient/family educated about specific return precautions for given chief complaint and symptoms.  Patient/family educated about follow-up with PCP.     Patient/family expressed understanding of return precautions and need for follow-up. Patient spoken to regarding all imaging and laboratory results and appropriate follow up for these results. All education provided in verbal form with additional information in written form. Time was allowed for answering of patient questions. Patient discharged.    Emergency Department Medication Summary:   Medications  cyclobenzaprine (FLEXERIL) tablet 10 mg (has no administration in time range)  oxyCODONE (Oxy IR/ROXICODONE) immediate release tablet 5 mg (5 mg Oral Given 08/29/22 2206)  iohexol (OMNIPAQUE) 350 MG/ML injection 100 mL (80 mLs Intravenous Contrast Given 08/29/22 2210)       Clinical Impression:  1. Chest pain, unspecified type      Discharge   Final Clinical Impression(s) / ED Diagnoses Final diagnoses:  Chest pain, unspecified type    Rx / DC Orders ED Discharge Orders          Ordered    cyclobenzaprine (FLEXERIL) 10 MG tablet  Daily at bedtime        08/29/22 2248    omeprazole (PRILOSEC) 20 MG capsule  Daily        08/29/22 2248    acetaminophen (TYLENOL) 500 MG tablet  Every 8 hours PRN        08/29/22 2248    ibuprofen (ADVIL) 400 MG tablet  Every 6 hours PRN        08/29/22 2248              Tretha Sciara, MD 08/29/22 2250

## 2022-08-29 NOTE — ED Triage Notes (Signed)
Right side upper abdominal pain/ chest pain Started 2 weeks ago, getting worse. Appears uncomfortable in triage

## 2022-09-10 ENCOUNTER — Ambulatory Visit: Payer: 59 | Admitting: Internal Medicine

## 2023-01-04 ENCOUNTER — Encounter: Payer: 59 | Admitting: Internal Medicine

## 2023-01-06 NOTE — Progress Notes (Signed)
Chief Complaint  Patient presents with   Annual Exam    HPI: Patient  Hannah [name]  62 y.o. comes in today for Preventive Health Care visit   Tka left march Cp ed visit march  Dr Lucretia Roers  seeing her for alopecia areata? Getting injections  Dr cousins  gyne remote h of endometrial cancer . Feeling well has form update for foster care kids.  Asks about screening  tests appropriate.  Eye drops same  vision Asthma stable rare use of inhaler  Health Maintenance  Topic Date Due   COVID-19 Vaccine (4 - 2023-24 season) 01/23/2023 (Originally 02/20/2022)   INFLUENZA VACCINE  01/21/2023   MAMMOGRAM  07/29/2023   Colonoscopy  02/20/2024   PAP SMEAR-Modifier  06/22/2024   DTaP/Tdap/Td (3 - Td or Tdap) 08/10/2024   Hepatitis C Screening  Completed   HIV Screening  Completed   Zoster Vaccines- Shingrix  Completed   HPV VACCINES  Aged Out   Health Maintenance Review LIFESTYLE:  Exercise:  60 min 3 x per week  .  Tobacco/ETS: n Alcohol:  n Sugar beverages: ginger ale once a day.  Sleep: 6-8  Drug use: no HH of  4  dog.  Work: work 40 - 60 hours    ROS:  GEN/ HEENT: No fever, significant weight changes sweats headaches hearing changes, CV/ PULM; No chest pain shortness of breath cough, syncope,edema  change in exercise tolerance. GI /GU: No adominal pain, vomiting, change in bowel habits. No blood in the stool. No significant GU symptoms. SKIN/HEME: ,no acute skin rashes suspicious lesions or bleeding. No lymphadenopathy, nodules, masses.  NEURO/ PSYCH:  No neurologic signs such as weakness numbness. No depression anxiety. IMM/ Allergy: No unusual infections.  Allergy .   REST of 12 system review negative except as per HPI   Past Medical History:  Diagnosis Date   Asthma    with MDI   Chest pain, atypical    CHEST PAIN-PRECORDIAL 10/26/2008   Qualifier: Diagnosis of  By: Excell Seltzer, MD, [place]    Chronic constipation    Endometrial cancer (HCC) 2011   Glaucoma     H/O: hysterectomy 06/22/2009   presented with irregular bleeding   ICP (infantile cerebral palsy) (HCC)    elevated   Iron deficiency anemia    MENOPAUSE, SURGICAL 01/06/2010   Qualifier: Diagnosis of  By: Fabian Sharp MD, Neta Mends    NEOPLASM, MALIGNANT, UTERUS, HX OF 01/06/2010   Qualifier: Diagnosis of  By: Fabian Sharp MD, Neta Mends    Nevus, atypical    Osteochondroma    OSTEOCHONDROMA 10/10/2008   Qualifier: Diagnosis of  By: Yetta Barre CMA, Chemira     Rhinitis    Seasonal allergies    Uterine cancer (HCC) 2011    Past Surgical History:  Procedure Laterality Date   CESAREAN SECTION     COLONOSCOPY  2014   DB-MAC-mov(fair)-normal redundant/tort colon -couldnt view cecal pouch-7-10 yr recall   FOOT SURGERY Bilateral    MENISCUS REPAIR Left 01/2019   MYOMECTOMY     SHOULDER ARTHROSCOPY WITH OPEN ROTATOR CUFF REPAIR Left    TOTAL KNEE ARTHROPLASTY Left 2022   TOTAL VAGINAL HYSTERECTOMY  2011   TUBAL LIGATION     UMBILICAL HERNIA REPAIR     WISDOM TOOTH EXTRACTION      Family History  Problem Relation Age of Onset   Hypertension Mother    Alcohol abuse Mother    Diabetes Father        died  from complications of dm, amputation and sepsis in his 1's    Colon polyps Father    Hypertension Sister    Colon polyps Sister    Breast cancer Maternal Aunt    Colon polyps Other    Colon cancer Neg Hx    Esophageal cancer Neg Hx    Pancreatic cancer Neg Hx    Liver disease Neg Hx     Social History   Socioeconomic History   Marital status: Married    Spouse name: Not on file   Number of children: 3   Years of education: Not on file   Highest education level: Not on file  Occupational History   Occupation: Emergency planning/management officer  Tobacco Use   Smoking status: Never   Smokeless tobacco: Never  Vaping Use   Vaping status: Never Used  Substance and Sexual Activity   Alcohol use: Yes    Alcohol/week: 0.0 standard drinks of alcohol    Comment: socially   Drug use: No   Sexual activity:  Not on file  Other Topics Concern   Not on file  Social History Narrative   The patient works as a Emergency planning/management officer.     She has been in the profession for 25 years.     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    She does not smoke cigarettes or use recreational drugs.     She drinks alcohol on rare occasions with 1 glass of wine. Approx. Once a month.  She does not do regular exercise.   recently    HH of 3 -4hysband and 81 yo son  Has exchange student from [place] now    Pet poodle   Husband is a smoker   [name] is married with 3 children, ages  One fr college another on her own       hh of 4    Social Determinants of Health   Financial Resource Strain: Low Risk  (01/07/2023)   Overall Financial Resource Strain (CARDIA)    Difficulty of Paying Living Expenses: Not hard at all  Food Insecurity: No Food Insecurity (01/07/2023)   Hunger Vital Sign    Worried About Running Out of Food in the Last Year: Never true    Ran Out of Food in the Last Year: Never true  Transportation Needs: No Transportation Needs (01/07/2023)   PRAPARE - Administrator, Civil Service (Medical): No    Lack of Transportation (Non-Medical): No  Physical Activity: Sufficiently Active (01/07/2023)   Exercise Vital Sign    Days of Exercise per Week: 3 days    Minutes of Exercise per Session: 60 min  Stress: No Stress Concern Present (01/07/2023)   [employer] of Occupational Health - Occupational Stress Questionnaire    Feeling of Stress : Not at all  Social Connections: Socially Integrated (01/07/2023)   Social Connection and Isolation Panel [NHANES]    Frequency of Communication with Friends and Family: More than three times a week    Frequency of Social Gatherings with Friends and Family: More than three times a week    Attends Religious Services: More than 4 times per year    Active Member of [employer] or Organizations: Yes    Attends Hospital doctor: More than 4 times per year    Marital Status: Married    Outpatient Medications Prior to Visit  Medication Sig Dispense Refill   acetaminophen (TYLENOL) 500 MG tablet Take  2 tablets (1,000 mg total) by mouth every 8 (eight) hours as needed for moderate pain. 30 tablet 0   albuterol (VENTOLIN HFA) 108 (90 Base) MCG/ACT inhaler USE 2 PUFFS EVERY 4 TO 6 HOURS AS NEEDED FOR SHORTNESS OF BREATH 18 g 0   CONCERTA 18 MG CR tablet Take 18 mg by mouth daily.     cyclobenzaprine (FLEXERIL) 10 MG tablet Take 1 tablet (10 mg total) by mouth at bedtime. 20 tablet 0   ibuprofen (ADVIL) 400 MG tablet Take 1 tablet (400 mg total) by mouth every 6 (six) hours as needed. 30 tablet 0   Minoxidil (ROGAINE MENS) 5 % FOAM See admin instructions.     MULTIPLE VITAMIN PO Take by mouth.     Multiple Vitamins-Minerals (HAIR/SKIN/NAILS) TABS Hair, Skin and Nails-Argan Oil     Omega-3 Fatty Acids (FISH OIL PO) Take 1 tablet by mouth daily.     Probiotic Product (PROBIOTIC FORMULA PO) Take by mouth.     raloxifene (EVISTA) 60 MG tablet Take 60 mg by mouth daily.     Tafluprost, PF, (ZIOPTAN) 0.0015 % SOLN Zioptan (PF) 0.0015 % eye drops in a dropperette     timolol (TIMOPTIC) 0.5 % ophthalmic solution Place 1 drop into both eyes daily.     omeprazole (PRILOSEC) 20 MG capsule Take 1 capsule (20 mg total) by mouth daily. 14 capsule 0   No facility-administered medications prior to visit.     EXAM:  BP 100/74 (BP Location: Right Arm, Patient Position: Sitting, Cuff Size: Large)   Pulse 76   Temp 97.8 F (36.6 C) (Oral)   Ht 5' 6.75" (1.695 m)   Wt 203 lb (92.1 kg)   SpO2 96%   BMI 32.03 kg/m   Body mass index is 32.03 kg/m. Wt Readings from Last 3 Encounters:  01/07/23 203 lb (92.1 kg)  12/31/21 208 lb 9.6 oz (94.6 kg)  08/19/21 221 lb (100.2 kg)    Physical Exam: Vital signs reviewed PJA:SNKN is a well-developed well-nourished alert cooperative    who appearsr stated age in no acute  distress.  HEENT: normocephalic atraumatic , Eyes: PERRL EOM's full, conjunctiva clear, Nares: paten,t no deformity discharge or tenderness., Ears: no deformity EAC's clear TMs with normal landmarks. Mouth: clear OP, no lesions, edema.  Moist mucous membranes. Dentition in adequate repair. NECK: supple without masses, thyromegaly or bruits. CHEST/PULM:  Clear to auscultation and percussion breath sounds equal no wheeze , rales or rhonchi. No chest wall deformities or tenderness. Breast: normal by inspection . No dimpling, discharge, masses, tenderness or discharge . CV: PMI is nondisplaced, S1 S2 no gallops, murmurs, rubs. Peripheral pulses are full without delay.No JVD .  ABDOMEN: Bowel sounds normal nontender  No guard or rebound, no hepato splenomegal no CVA tenderness.   Extremtities:  No clubbing cyanosis or edema, no acute joint swelling or redness no focal atrophy NEURO:  Oriented x3, cranial nerves 3-12 appear to be intact, no obvious focal weakness,gait within normal limits no abnormal reflexes or asymmetrical SKIN: No acute rashes normal turgor, color, no bruising or petechiae. PSYCH: Oriented, good eye contact, no obvious depression anxiety, cognition and judgment appear normal. LN: no cervical axillary iadenopathy  Lab Results  Component Value Date   WBC 6.1 01/07/2023   HGB 14.0 01/07/2023   HCT 43.0 01/07/2023   PLT 261.0 01/07/2023   GLUCOSE 93 01/07/2023   CHOL 187 01/07/2023   TRIG 58.0 01/07/2023   HDL 46.40 01/07/2023   LDLDIRECT  137.9 10/10/2008   LDLCALC 129 (H) 01/07/2023   ALT 13 01/07/2023   AST 18 01/07/2023   NA 140 01/07/2023   K 3.9 01/07/2023   CL 104 01/07/2023   CREATININE 1.08 01/07/2023   BUN 17 01/07/2023   CO2 29 01/07/2023   TSH 1.22 01/07/2023   HGBA1C 6.2 01/07/2023    BP Readings from Last 3 Encounters:  01/07/23 100/74  08/29/22 108/66  12/31/21 116/78    Lab plan  with patient   ASSESSMENT AND PLAN:  Discussed the following  assessment and plan:    ICD-10-CM   1. Visit for preventive health examination  Z00.00 Basic metabolic panel    CBC with Differential/Platelet    Hemoglobin A1c    Hepatic function panel    Lipid panel    TSH    T4, free    T3, free    Lipoprotein A (LPA)    2. Medication management  Z79.899 Basic metabolic panel    CBC with Differential/Platelet    Hemoglobin A1c    Hepatic function panel    Lipid panel    TSH    T4, free    T3, free    3. Uncomplicated asthma, unspecified asthma severity, unspecified whether persistent  J45.909 Basic metabolic panel    CBC with Differential/Platelet    Hemoglobin A1c    Hepatic function panel    Lipid panel    TSH    T4, free    T3, free    4. Elevated cholesterol  E78.00 CT CARDIAC SCORING (SELF PAY ONLY)    Basic metabolic panel    CBC with Differential/Platelet    Hemoglobin A1c    Hepatic function panel    Lipid panel    TSH    T4, free    T3, free    Lipoprotein A (LPA)    5. Cardiovascular risk factor  Z91.89 CT CARDIAC SCORING (SELF PAY ONLY)    Basic metabolic panel    CBC with Differential/Platelet    Hemoglobin A1c    Hepatic function panel    Lipid panel    TSH    T4, free    T3, free    Lipoprotein A (LPA)    6. Hair loss disorder  L65.9 Basic metabolic panel    CBC with Differential/Platelet    Hemoglobin A1c    Hepatic function panel    Lipid panel    TSH    T4, free    T3, free    Reviewed  screening recs.  And risk assessment  Consider ct calcium score  She had a pulm ct r/o emboli in spring   and no alarming sx .  Form for foster care  signed no restrictions.  Return in 1 year (on 01/07/2024) for depending on results or yearly .  Patient Care Team: Shatonya Passon, Neta Mends, MD as PCP - General Maxie Better, MD (Obstetrics and Gynecology) Tonny Bollman, MD (Cardiology) Ollen Gross, MD as Consulting Physician (Orthopedic Surgery) Patient Instructions  Good to see you today. Tell front desk  coming back for labs today . ( Will incllude thyroid)   Will be contacted about ct calcium scan of heart for ris assessment.   Then we can do a fu virtual ok if needed to  review results    Neta Mends. Tiffanni Scarfo M.D.

## 2023-01-07 ENCOUNTER — Ambulatory Visit (INDEPENDENT_AMBULATORY_CARE_PROVIDER_SITE_OTHER): Payer: 59 | Admitting: Internal Medicine

## 2023-01-07 ENCOUNTER — Encounter: Payer: Self-pay | Admitting: Internal Medicine

## 2023-01-07 VITALS — BP 100/74 | HR 76 | Temp 97.8°F | Ht 66.75 in | Wt 203.0 lb

## 2023-01-07 DIAGNOSIS — Z79899 Other long term (current) drug therapy: Secondary | ICD-10-CM | POA: Diagnosis not present

## 2023-01-07 DIAGNOSIS — J45909 Unspecified asthma, uncomplicated: Secondary | ICD-10-CM

## 2023-01-07 DIAGNOSIS — Z9189 Other specified personal risk factors, not elsewhere classified: Secondary | ICD-10-CM

## 2023-01-07 DIAGNOSIS — E7800 Pure hypercholesterolemia, unspecified: Secondary | ICD-10-CM

## 2023-01-07 DIAGNOSIS — Z0000 Encounter for general adult medical examination without abnormal findings: Secondary | ICD-10-CM

## 2023-01-07 DIAGNOSIS — L659 Nonscarring hair loss, unspecified: Secondary | ICD-10-CM

## 2023-01-07 LAB — CBC WITH DIFFERENTIAL/PLATELET
Basophils Absolute: 0 10*3/uL (ref 0.0–0.1)
Basophils Relative: 0.2 % (ref 0.0–3.0)
Eosinophils Absolute: 0.1 10*3/uL (ref 0.0–0.7)
Eosinophils Relative: 2.2 % (ref 0.0–5.0)
HCT: 43 % (ref 36.0–46.0)
Hemoglobin: 14 g/dL (ref 12.0–15.0)
Lymphocytes Relative: 43.1 % (ref 12.0–46.0)
Lymphs Abs: 2.6 10*3/uL (ref 0.7–4.0)
MCHC: 32.5 g/dL (ref 30.0–36.0)
MCV: 86.1 fl (ref 78.0–100.0)
Monocytes Absolute: 0.5 10*3/uL (ref 0.1–1.0)
Monocytes Relative: 8 % (ref 3.0–12.0)
Neutro Abs: 2.8 10*3/uL (ref 1.4–7.7)
Neutrophils Relative %: 46.5 % (ref 43.0–77.0)
Platelets: 261 10*3/uL (ref 150.0–400.0)
RBC: 5 Mil/uL (ref 3.87–5.11)
RDW: 14 % (ref 11.5–15.5)
WBC: 6.1 10*3/uL (ref 4.0–10.5)

## 2023-01-07 LAB — HEPATIC FUNCTION PANEL
ALT: 13 U/L (ref 0–35)
AST: 18 U/L (ref 0–37)
Albumin: 4.4 g/dL (ref 3.5–5.2)
Alkaline Phosphatase: 65 U/L (ref 39–117)
Bilirubin, Direct: 0.1 mg/dL (ref 0.0–0.3)
Total Bilirubin: 0.6 mg/dL (ref 0.2–1.2)
Total Protein: 7.4 g/dL (ref 6.0–8.3)

## 2023-01-07 LAB — LIPID PANEL
Cholesterol: 187 mg/dL (ref 0–200)
HDL: 46.4 mg/dL (ref >39.00)
LDL Cholesterol: 129 mg/dL — ABNORMAL HIGH (ref 0–99)
NonHDL: 140.36
Total CHOL/HDL Ratio: 4
Triglycerides: 58 mg/dL (ref 0.0–149.0)
VLDL: 11.6 mg/dL (ref 0.0–40.0)

## 2023-01-07 LAB — BASIC METABOLIC PANEL
BUN: 17 mg/dL (ref 6–23)
CO2: 29 mEq/L (ref 19–32)
Calcium: 9.8 mg/dL (ref 8.4–10.5)
Chloride: 104 mEq/L (ref 96–112)
Creatinine, Ser: 1.08 mg/dL (ref 0.40–1.20)
GFR: 55.21 mL/min — ABNORMAL LOW (ref >60.00)
Glucose, Bld: 93 mg/dL (ref 70–99)
Potassium: 3.9 mEq/L (ref 3.5–5.1)
Sodium: 140 mEq/L (ref 135–145)

## 2023-01-07 LAB — T4, FREE: Free T4: 0.68 ng/dL (ref 0.60–1.60)

## 2023-01-07 LAB — HEMOGLOBIN A1C: Hgb A1c MFr Bld: 6.2 % (ref 4.6–6.5)

## 2023-01-07 LAB — T3, FREE: T3, Free: 3.2 pg/mL (ref 2.3–4.2)

## 2023-01-07 LAB — TSH: TSH: 1.22 u[IU]/mL (ref 0.35–5.50)

## 2023-01-07 NOTE — Patient Instructions (Signed)
Good to see you today. Tell front desk coming back for labs today . ( Will incllude thyroid)   Will be contacted about ct calcium scan of heart for ris assessment.   Then we can do a fu virtual ok if needed to  review results

## 2023-01-08 ENCOUNTER — Encounter: Payer: Self-pay | Admitting: Internal Medicine

## 2023-01-08 NOTE — Progress Notes (Signed)
Results stable gfr borderline   but not increase. . Cholesterol the same slight up ldlblood count liver and thryoid tests are normal  Hg A1c is in borderline prediabetes range but no diabetes and stable   Proceed with the ct calcium scan as planned . Avoid sweets sugars and simple carbs . And will follow yearly or as indicated  Lipo a readings pending

## 2023-01-10 LAB — LIPOPROTEIN A (LPA): Lipoprotein (a): 68 nmol/L (ref <75)

## 2023-01-11 NOTE — Progress Notes (Signed)
Lipo a results is in optimal range.

## 2023-01-12 ENCOUNTER — Other Ambulatory Visit: Payer: Self-pay | Admitting: Internal Medicine

## 2023-01-12 DIAGNOSIS — R944 Abnormal results of kidney function studies: Secondary | ICD-10-CM

## 2023-01-12 NOTE — Progress Notes (Signed)
Future lab ordered

## 2023-01-12 NOTE — Progress Notes (Signed)
I am in agreement  , would prefer to have labs done before the visit to review with you.   ( So I have asked patients to get me a message at least 2-3 weeks before their visit  and request to have labs  ordered to be done before visit .   I will then place orders on chart review . )  this usually works out ok even if  scheduler says insurance may not pay.   . (Usually  the  uneven insurance coverage for blood/lab test  is the reason administration made the  policy ) In regard to the gfr this is calculated and and estimate  should  be followed   ..  uncertain  how clinically significant .the result is at this time        I suggest we can repeat the lab  and check urine to make sure  not elevated  in urine.   The definitive assessments are a 24 hour urine collection and some other labs but usually  not needed .   I would  want to get  your ct coronary scan  result and  blood and urine tests repeat  at your convenience . Then can do a virtual visits or in person to  review findings

## 2023-01-12 NOTE — Progress Notes (Signed)
Lab orders have been placed

## 2023-01-15 ENCOUNTER — Other Ambulatory Visit (INDEPENDENT_AMBULATORY_CARE_PROVIDER_SITE_OTHER): Payer: 59

## 2023-01-15 DIAGNOSIS — R944 Abnormal results of kidney function studies: Secondary | ICD-10-CM

## 2023-01-15 LAB — BASIC METABOLIC PANEL
BUN: 16 mg/dL (ref 6–23)
CO2: 27 mEq/L (ref 19–32)
Calcium: 9.3 mg/dL (ref 8.4–10.5)
Chloride: 106 mEq/L (ref 96–112)
Creatinine, Ser: 1.04 mg/dL (ref 0.40–1.20)
GFR: 57.75 mL/min — ABNORMAL LOW (ref >60.00)
Glucose, Bld: 86 mg/dL (ref 70–99)
Potassium: 4 mEq/L (ref 3.5–5.1)
Sodium: 140 mEq/L (ref 135–145)

## 2023-01-15 LAB — MICROALBUMIN / CREATININE URINE RATIO
Creatinine,U: 125.5 mg/dL
Microalb Creat Ratio: 0.6 mg/g (ref 0.0–30.0)
Microalb, Ur: <0.7 mg/dL (ref 0.0–1.9)

## 2023-01-15 LAB — URINALYSIS, ROUTINE W REFLEX MICROSCOPIC
Bilirubin Urine: NEGATIVE
Hgb urine dipstick: NEGATIVE
Ketones, ur: NEGATIVE
Nitrite: NEGATIVE
RBC / HPF: NONE SEEN (ref 0–?)
Specific Gravity, Urine: 1.025 (ref 1.000–1.030)
Total Protein, Urine: NEGATIVE
Urine Glucose: NEGATIVE
Urobilinogen, UA: 0.2 (ref 0.0–1.0)
pH: 5.5 (ref 5.0–8.0)

## 2023-01-19 NOTE — Progress Notes (Signed)
Good news  no protein in urine and cystatin c give a normal gfr .  No further work up needed in this area .awaiting the ct coronary scan results

## 2023-01-20 NOTE — Progress Notes (Signed)
Already spoke with pt. See note

## 2023-03-02 ENCOUNTER — Ambulatory Visit (HOSPITAL_COMMUNITY)
Admission: RE | Admit: 2023-03-02 | Discharge: 2023-03-02 | Disposition: A | Discharge status: Home / Self Care | Payer: 59 | Source: Ambulatory Visit | Attending: Internal Medicine | Admitting: Internal Medicine

## 2023-03-02 DIAGNOSIS — E7800 Pure hypercholesterolemia, unspecified: Secondary | ICD-10-CM | POA: Insufficient documentation

## 2023-03-02 DIAGNOSIS — Z9189 Other specified personal risk factors, not elsewhere classified: Secondary | ICD-10-CM | POA: Insufficient documentation

## 2023-03-08 NOTE — Progress Notes (Signed)
Ct calcium score is 0 is favorable and lower risk . Continue lifestyle intervention healthy eating and exercise .  At this time  low 10 year risk of vascular event   The 10-year ASCVD risk score (Arnett DK, et al., 2019) is: 3.3%   Values used to calculate the score:     Age: 62 years     Sex: Female     Is Non-Hispanic African American: Yes     Diabetic: No     Tobacco smoker: No     Systolic Blood Pressure: 100 mmHg     Is BP treated: No     HDL Cholesterol: 46.4 mg/dL     Total Cholesterol: 187 mg/dL

## 2023-03-15 NOTE — Progress Notes (Signed)
Over read  of Ct scanshows no significant abnormality . reasssuring

## 2023-09-28 ENCOUNTER — Other Ambulatory Visit: Payer: Self-pay | Admitting: Internal Medicine

## 2023-09-28 ENCOUNTER — Telehealth: Payer: Self-pay | Admitting: Internal Medicine

## 2023-09-28 DIAGNOSIS — R739 Hyperglycemia, unspecified: Secondary | ICD-10-CM

## 2023-09-28 DIAGNOSIS — R944 Abnormal results of kidney function studies: Secondary | ICD-10-CM

## 2023-09-28 DIAGNOSIS — Z0000 Encounter for general adult medical examination without abnormal findings: Secondary | ICD-10-CM

## 2023-09-28 DIAGNOSIS — E7800 Pure hypercholesterolemia, unspecified: Secondary | ICD-10-CM

## 2023-09-28 DIAGNOSIS — Z79899 Other long term (current) drug therapy: Secondary | ICD-10-CM

## 2023-09-28 NOTE — Progress Notes (Signed)
 Future orders have been placed for summer  cpe

## 2023-09-28 NOTE — Telephone Encounter (Signed)
 Pt has a physical appt scheduled for the end of July.  She is requesting to get her lab work done a week ahead of time.  She will need orders for her labs.  Once lab orders are put in the system I will give her a call back to get her scheduled.

## 2023-11-01 ENCOUNTER — Other Ambulatory Visit: Payer: Self-pay | Admitting: Internal Medicine

## 2023-11-01 DIAGNOSIS — J45909 Unspecified asthma, uncomplicated: Secondary | ICD-10-CM

## 2023-11-03 ENCOUNTER — Telehealth: Payer: Self-pay | Admitting: Internal Medicine

## 2023-11-03 ENCOUNTER — Encounter: Payer: Self-pay | Admitting: Internal Medicine

## 2023-11-03 MED ORDER — ALBUTEROL SULFATE HFA 108 (90 BASE) MCG/ACT IN AERS: INHALATION_SPRAY | RESPIRATORY_TRACT | 0 refills | Status: DC

## 2023-11-03 NOTE — Telephone Encounter (Unsigned)
 Copied from CRM [id number]. Topic: Clinical - Medication Refill >> Nov 03, 2023  2:39 PM [name] wrote: Medication: albuterol  (VENTOLIN  HFA) 108 (90 Base) MCG/ACT inhaler  Has the patient contacted their pharmacy? No (Agent: If no, request that the patient contact the pharmacy for the refill. If patient does not wish to contact the pharmacy document the reason why and proceed with request.) (Agent: If yes, when and what did the pharmacy advise?)  This is the patient's preferred pharmacy:    [facility] [address] ([address]), [place] - [address] [place] ([place]) [place] [address] Phone: [phone] Fax: [phone]   Is this the correct pharmacy for this prescription? Yes If no, delete pharmacy and type the correct one.   Has the prescription been filled recently? No  Is the patient out of the medication? Yes  Has the patient been seen for an appointment in the last year OR does the patient have an upcoming appointment? Yes  Can we respond through MyChart? Yes  Agent: Please be advised that Rx refills may take up to 3 business days. We ask that you follow-up with your pharmacy.

## 2023-11-05 ENCOUNTER — Other Ambulatory Visit: Payer: Self-pay

## 2023-11-05 DIAGNOSIS — J45909 Unspecified asthma, uncomplicated: Secondary | ICD-10-CM

## 2023-11-05 MED ORDER — ALBUTEROL SULFATE HFA 108 (90 BASE) MCG/ACT IN AERS: INHALATION_SPRAY | RESPIRATORY_TRACT | 0 refills | Status: AC

## 2023-11-05 NOTE — Telephone Encounter (Signed)
 Attempted to reach pt. Left a detail message that requested Rx is sent. If have questions, to give us  a call back.

## 2023-11-09 ENCOUNTER — Telehealth: Admitting: Internal Medicine

## 2023-11-09 ENCOUNTER — Encounter: Payer: Self-pay | Admitting: Internal Medicine

## 2023-11-09 DIAGNOSIS — R053 Chronic cough: Secondary | ICD-10-CM

## 2023-11-09 DIAGNOSIS — J45909 Unspecified asthma, uncomplicated: Secondary | ICD-10-CM | POA: Diagnosis not present

## 2023-11-09 MED ORDER — BENZONATATE 100 MG PO CAPS: 100.0000 mg | ORAL_CAPSULE | Freq: Three times a day (TID) | ORAL | 0 refills | Status: DC | PRN

## 2023-11-09 MED ORDER — AZITHROMYCIN 250 MG PO TABS: ORAL_TABLET | ORAL | 0 refills | Status: AC

## 2023-11-09 MED ORDER — PREDNISONE 50 MG PO TABS: ORAL_TABLET | ORAL | 0 refills | Status: DC

## 2023-11-09 NOTE — Progress Notes (Signed)
 Virtual Visit via Video Note  I connected with Hannah [name] on 11/09/23 at  8:30 AM EDT by a video enabled telemedicine application and verified that I am speaking with the correct person using two identifiers. Location patient: vehicle  Location provider:work office Persons participating in the virtual visit: patient, provider   Patient aware  of the limitations of evaluation and management by telemedicine and  availability of in person appointments. and agreed to proceed.   HPI: Hannah [name] presents for video visit Onset about 3 weeks ago with runny nose cough and body aches sweats at night  lasting about 3 days and then persistent cough  and upper congestion  despite  otc claritin w  d other  aids  and mucinex dm etc    cough is dry tight and persistent worse at night  . Renewed albuterol  and helps temporarily.   No cp sob or sinus pain  fever.   No other allergy sx past hx of  "asthma" but no sob ROS: See pertinent positives and negatives per HPI.  Past Medical History:  Diagnosis Date   Asthma    with MDI   Chest pain, atypical    CHEST PAIN-PRECORDIAL 10/26/2008   Qualifier: Diagnosis of  By: Arlester Ladd, MD, Nathanael Baker    Chronic constipation    Endometrial cancer (HCC) 2011   Glaucoma    H/O: hysterectomy 06/22/2009   presented with irregular bleeding   ICP (infantile cerebral palsy) (HCC)    elevated   Iron deficiency anemia    MENOPAUSE, SURGICAL 01/06/2010   Qualifier: Diagnosis of  By: Ethel Henry MD, Joaquim Muir    NEOPLASM, MALIGNANT, UTERUS, HX OF 01/06/2010   Qualifier: Diagnosis of  By: Ethel Henry MD, Joaquim Muir    Nevus, atypical    Osteochondroma    OSTEOCHONDROMA 10/10/2008   Qualifier: Diagnosis of  By: Rochelle Chu CMA, Chemira     Rhinitis    Seasonal allergies    Uterine cancer (HCC) 2011    Past Surgical History:  Procedure Laterality Date   CESAREAN SECTION     COLONOSCOPY  2014   DB-MAC-mov(fair)-normal redundant/tort colon -couldnt view  cecal pouch-7-10 yr recall   FOOT SURGERY Bilateral    MENISCUS REPAIR Left 01/2019   MYOMECTOMY     SHOULDER ARTHROSCOPY WITH OPEN ROTATOR CUFF REPAIR Left    TOTAL KNEE ARTHROPLASTY Left 2022   TOTAL VAGINAL HYSTERECTOMY  2011   TUBAL LIGATION     UMBILICAL HERNIA REPAIR     WISDOM TOOTH EXTRACTION      Family History  Problem Relation Age of Onset   Hypertension Mother    Alcohol abuse Mother    Diabetes Father        died from complications of dm, amputation and sepsis in his 83's    Colon polyps Father    Hypertension Sister    Colon polyps Sister    Breast cancer Maternal Aunt    Colon polyps Other    Colon cancer Neg Hx    Esophageal cancer Neg Hx    Pancreatic cancer Neg Hx    Liver disease Neg Hx     Social History   Tobacco Use   Smoking status: Never   Smokeless tobacco: Never  Vaping Use   Vaping status: Never Used  Substance Use Topics   Alcohol use: Yes    Alcohol/week: 0.0 standard drinks of alcohol    Comment: socially   Drug use: No      Current Outpatient  Medications:    acetaminophen  (TYLENOL ) 500 MG tablet, Take 2 tablets (1,000 mg total) by mouth every 8 (eight) hours as needed for moderate pain., Disp: 30 tablet, Rfl: 0   albuterol  (VENTOLIN  HFA) 108 (90 Base) MCG/ACT inhaler, USE 2 PUFFS EVERY 4 TO 6 HOURS AS NEEDED FOR SHORTNESS OF BREATH, Disp: 18 g, Rfl: 0   azithromycin  (ZITHROMAX ) 250 MG tablet, Take 2 tablets on day 1, then 1 tablet daily on days 2 through 5, Disp: 6 tablet, Rfl: 0   benzonatate  (TESSALON ) 100 MG capsule, Take 1-2 capsules (100-200 mg total) by mouth 3 (three) times daily as needed for cough., Disp: 24 capsule, Rfl: 0   CONCERTA 18 MG CR tablet, Take 18 mg by mouth daily., Disp: , Rfl:    cyclobenzaprine  (FLEXERIL ) 10 MG tablet, Take 1 tablet (10 mg total) by mouth at bedtime., Disp: 20 tablet, Rfl: 0   ibuprofen  (ADVIL ) 400 MG tablet, Take 1 tablet (400 mg total) by mouth every 6 (six) hours as needed., Disp: 30  tablet, Rfl: 0   Minoxidil (ROGAINE MENS) 5 % FOAM, See admin instructions., Disp: , Rfl:    MULTIPLE VITAMIN PO, Take by mouth., Disp: , Rfl:    Multiple Vitamins-Minerals (HAIR/SKIN/NAILS) TABS, Hair, Skin and Nails-Argan Oil, Disp: , Rfl:    Omega-3 Fatty Acids (FISH OIL PO), Take 1 tablet by mouth daily., Disp: , Rfl:    predniSONE  (DELTASONE ) 50 MG tablet, 1 by mouth each day for asthmatic bronchitis for 3-5 days, Disp: 5 tablet, Rfl: 0   Probiotic Product (PROBIOTIC FORMULA PO), Take by mouth., Disp: , Rfl:    raloxifene (EVISTA) 60 MG tablet, Take 60 mg by mouth daily., Disp: , Rfl:    Tafluprost, PF, (ZIOPTAN) 0.0015 % SOLN, Zioptan (PF) 0.0015 % eye drops in a dropperette, Disp: , Rfl:    timolol (TIMOPTIC) 0.5 % ophthalmic solution, Place 1 drop into both eyes daily., Disp: , Rfl:   EXAM: BP Readings from Last 3 Encounters:  01/07/23 100/74  08/29/22 108/66  12/31/21 116/78    VITALS per patient if applicable:  GENERAL: alert, oriented, appears well and in no acute distressbut  frequent cough  somewhat bronchitic  no dyspnea some u pper resp congestion  HEENT: atraumatic, conjunttiva clear, no obvious abnormalities on inspection of external nose and ears  NECK: normal movements of the head and neck  LUNGS: on inspection no signs of respiratory distress, breathing rate appears normal, no obvious gross SOB, gasping or wheezing  CV: no obvious cyanosis  MS: moves all visible extremities without noticeable abnormality  PSYCH/NEURO: pleasant and cooperative, no obvious depression or anxiety, speech and thought processing grossly intact Lab Results  Component Value Date   WBC 6.1 01/07/2023   HGB 14.0 01/07/2023   HCT 43.0 01/07/2023   PLT 261.0 01/07/2023   GLUCOSE 86 01/15/2023   CHOL 187 01/07/2023   TRIG 58.0 01/07/2023   HDL 46.40 01/07/2023   LDLDIRECT 137.9 10/10/2008   LDLCALC 129 (H) 01/07/2023   ALT 13 01/07/2023   AST 18 01/07/2023   NA 140 01/15/2023    K 4.0 01/15/2023   CL 106 01/15/2023   CREATININE 1.04 01/15/2023   BUN 16 01/15/2023   CO2 27 01/15/2023   TSH 1.22 01/07/2023   HGBA1C 6.2 01/07/2023   MICROALBUR <0.7 01/15/2023    ASSESSMENT AND PLAN:  Discussed the following assessment and plan:    ICD-10-CM   1. Persistent cough for 3 weeks or longer  R05.3    suspect  infection triggered and secondary  bornchospasm based on hx and clinical sx    2. Acute asthmatic bronchitis  J45.909      Persistent cough sounds like resp induced wheezing  post infectious   Empriric antibiotic and pred ;  add tessalon  perles  to see if helps sx at night. Continue albuterol   Counseled.   Expectant management and discussion of plan and treatment with opportunity to ask questions and all were answered. The patient agreed with the plan and demonstrated an understanding of the instructions.  fu in person if  needed  Advised to call back or seek an in-person evaluation if worsening  or having  further concerns  in interim. Return if symptoms worsen or fail to improve as expected.    Daphine Eagle, MD

## 2024-01-11 ENCOUNTER — Encounter: Payer: 59 | Admitting: Internal Medicine

## 2024-01-11 ENCOUNTER — Other Ambulatory Visit (INDEPENDENT_AMBULATORY_CARE_PROVIDER_SITE_OTHER)

## 2024-01-11 DIAGNOSIS — Z0000 Encounter for general adult medical examination without abnormal findings: Secondary | ICD-10-CM

## 2024-01-11 DIAGNOSIS — E7800 Pure hypercholesterolemia, unspecified: Secondary | ICD-10-CM | POA: Diagnosis not present

## 2024-01-11 DIAGNOSIS — R944 Abnormal results of kidney function studies: Secondary | ICD-10-CM

## 2024-01-11 DIAGNOSIS — R739 Hyperglycemia, unspecified: Secondary | ICD-10-CM | POA: Diagnosis not present

## 2024-01-11 DIAGNOSIS — Z79899 Other long term (current) drug therapy: Secondary | ICD-10-CM | POA: Diagnosis not present

## 2024-01-11 LAB — LIPID PANEL
Cholesterol: 201 mg/dL — ABNORMAL HIGH (ref 0–200)
HDL: 42.8 mg/dL (ref >39.00)
LDL Cholesterol: 140 mg/dL — ABNORMAL HIGH (ref 0–99)
NonHDL: 157.81
Total CHOL/HDL Ratio: 5
Triglycerides: 91 mg/dL (ref 0.0–149.0)
VLDL: 18.2 mg/dL (ref 0.0–40.0)

## 2024-01-11 LAB — CBC WITH DIFFERENTIAL/PLATELET
Basophils Absolute: 0 K/uL (ref 0.0–0.1)
Basophils Relative: 0.3 % (ref 0.0–3.0)
Eosinophils Absolute: 0.1 K/uL (ref 0.0–0.7)
Eosinophils Relative: 2.4 % (ref 0.0–5.0)
HCT: 42.2 % (ref 36.0–46.0)
Hemoglobin: 13.7 g/dL (ref 12.0–15.0)
Lymphocytes Relative: 37.9 % (ref 12.0–46.0)
Lymphs Abs: 2.2 K/uL (ref 0.7–4.0)
MCHC: 32.6 g/dL (ref 30.0–36.0)
MCV: 84.8 fl (ref 78.0–100.0)
Monocytes Absolute: 0.5 K/uL (ref 0.1–1.0)
Monocytes Relative: 8.8 % (ref 3.0–12.0)
Neutro Abs: 2.9 K/uL (ref 1.4–7.7)
Neutrophils Relative %: 50.6 % (ref 43.0–77.0)
Platelets: 258 K/uL (ref 150.0–400.0)
RBC: 4.97 Mil/uL (ref 3.87–5.11)
RDW: 13.7 % (ref 11.5–15.5)
WBC: 5.7 K/uL (ref 4.0–10.5)

## 2024-01-11 LAB — MICROALBUMIN / CREATININE URINE RATIO
Creatinine,U: 206.3 mg/dL
Microalb Creat Ratio: 3.5 mg/g (ref 0.0–30.0)
Microalb, Ur: 0.7 mg/dL (ref 0.0–1.9)

## 2024-01-11 LAB — BASIC METABOLIC PANEL WITH GFR
BUN: 14 mg/dL (ref 6–23)
CO2: 30 meq/L (ref 19–32)
Calcium: 9.6 mg/dL (ref 8.4–10.5)
Chloride: 103 meq/L (ref 96–112)
Creatinine, Ser: 1.07 mg/dL (ref 0.40–1.20)
GFR: 55.43 mL/min — ABNORMAL LOW (ref >60.00)
Glucose, Bld: 115 mg/dL — ABNORMAL HIGH (ref 70–99)
Potassium: 4.2 meq/L (ref 3.5–5.1)
Sodium: 139 meq/L (ref 135–145)

## 2024-01-11 LAB — HEPATIC FUNCTION PANEL
ALT: 12 U/L (ref 0–35)
AST: 16 U/L (ref 0–37)
Albumin: 4.4 g/dL (ref 3.5–5.2)
Alkaline Phosphatase: 58 U/L (ref 39–117)
Bilirubin, Direct: 0.1 mg/dL (ref 0.0–0.3)
Total Bilirubin: 0.5 mg/dL (ref 0.2–1.2)
Total Protein: 7.3 g/dL (ref 6.0–8.3)

## 2024-01-11 LAB — HEMOGLOBIN A1C: Hgb A1c MFr Bld: 6.3 % (ref 4.6–6.5)

## 2024-01-11 LAB — TSH: TSH: 2.14 u[IU]/mL (ref 0.35–5.50)

## 2024-01-18 ENCOUNTER — Encounter: Payer: Self-pay | Admitting: Internal Medicine

## 2024-01-18 ENCOUNTER — Ambulatory Visit (INDEPENDENT_AMBULATORY_CARE_PROVIDER_SITE_OTHER): Admitting: Internal Medicine

## 2024-01-18 VITALS — BP 88/68 | HR 71 | Temp 97.7°F | Ht 66.75 in | Wt 214.0 lb

## 2024-01-18 DIAGNOSIS — L659 Nonscarring hair loss, unspecified: Secondary | ICD-10-CM | POA: Diagnosis not present

## 2024-01-18 DIAGNOSIS — Z0000 Encounter for general adult medical examination without abnormal findings: Secondary | ICD-10-CM | POA: Diagnosis not present

## 2024-01-18 DIAGNOSIS — R739 Hyperglycemia, unspecified: Secondary | ICD-10-CM | POA: Diagnosis not present

## 2024-01-18 NOTE — Patient Instructions (Addendum)
 Good to see you today .  Attention to Continue lifestyle intervention healthy eating and exercise .   Add resistance training exercise for muscles  Avoid calories in beverages  hydrate.SABRA   Optimize   what  you eat when eating out.   Plan 6 mos visit and we can do hg A1c at the visit to follow blood sugar .   Consider getting update pneumonia vaccine at age 63  prevnar 20 or equivalent

## 2024-01-18 NOTE — Progress Notes (Signed)
 Chief Complaint  Patient presents with   Annual Exam    HPI: Patient  Hannah [name]  63 y.o. comes in today for Preventive Health Care visit   Had knee replacement and doing well [facility] derm care for hair alopecia now on spironolac 100 per day and con topical minoxidil  No  sx of low bp or syncope   Doing fine   Health Maintenance  Topic Date Due   Pneumococcal Vaccine 40-39 Years old (3 of 3 - PCV20 or PCV21) 08/15/2018   MAMMOGRAM  07/29/2023   Colonoscopy  02/20/2024   COVID-19 Vaccine (4 - 2024-25 season) 02/03/2024 (Originally 02/21/2023)   INFLUENZA VACCINE  01/21/2024   DTaP/Tdap/Td (8 - Td or Tdap) 08/10/2024   Hepatitis C Screening  Completed   HIV Screening  Completed   Zoster Vaccines- Shingrix   Completed   Hepatitis B Vaccines  Aged Out   HPV VACCINES  Aged Out   Meningococcal B Vaccine  Aged Out   Health Maintenance Review LIFESTYLE:  Exercise:  30 min walking per day . 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 GI /GU: No adominal pain, vomiting, change in bowel habits. No blood in the stool. No significant GU symptoms. SKIN/HEME: ,no acute skin rashes suspicious lesions or bleeding. No lymphadenopathy, nodules, masses.  NEURO/ PSYCH:  No neurologic signs such as weakness numbness. No depression anxiety. IMM/ Allergy: No unusual infections.  Allergy .   REST of 12 system review negative except as per HPI   Past Medical History:  Diagnosis Date   Asthma    with MDI   Chest pain, atypical    CHEST PAIN-PRECORDIAL 10/26/2008   Qualifier: Diagnosis of  By: Wonda, MD, Ozell Lenis    Chronic constipation    Endometrial cancer (HCC) 2011   Glaucoma    H/O: hysterectomy  06/22/2009   presented with irregular bleeding   ICP (infantile cerebral palsy) (HCC)    elevated   Iron deficiency anemia    MENOPAUSE, SURGICAL 01/06/2010   Qualifier: Diagnosis of  By: Charlett MD, Apolinar POUR    NEOPLASM, MALIGNANT, UTERUS, HX OF 01/06/2010   Qualifier: Diagnosis of  By: Charlett MD, Apolinar POUR    Nevus, atypical    Osteochondroma    OSTEOCHONDROMA 10/10/2008   Qualifier: Diagnosis of  By: Joshua CMA, Chemira     Rhinitis    Seasonal allergies    Uterine cancer (HCC) 2011    Past Surgical History:  Procedure Laterality Date   CESAREAN SECTION     COLONOSCOPY  2014   DB-MAC-mov(fair)-normal redundant/tort colon -couldnt view cecal pouch-7-10 yr recall   FOOT SURGERY Bilateral    MENISCUS REPAIR Left 01/2019   MYOMECTOMY     SHOULDER ARTHROSCOPY WITH OPEN ROTATOR CUFF REPAIR Left    TOTAL KNEE ARTHROPLASTY Left 2022   TOTAL VAGINAL HYSTERECTOMY  2011   TUBAL LIGATION     UMBILICAL HERNIA REPAIR     WISDOM TOOTH EXTRACTION      Family History  Problem Relation Age of Onset   Hypertension Mother    Alcohol abuse Mother    Diabetes Father        died from complications of  dm, amputation and sepsis in his 46's    Colon polyps Father    Hypertension Sister    Colon polyps Sister    Breast cancer Maternal Aunt    Colon polyps Other    Colon cancer Neg Hx    Esophageal cancer Neg Hx    Pancreatic cancer Neg Hx    Liver disease Neg Hx     Social History   Socioeconomic History   Marital status: Married    Spouse name: Not on file   Number of children: 3   Years of education: Not on file   Highest education level: Master's degree (e.g., MA, MS, MEng, MEd, MSW, MBA)  Occupational History   Occupation: Emergency planning/management officer  Tobacco Use   Smoking status: Never   Smokeless tobacco: Never  Vaping Use   Vaping status: Never Used  Substance and Sexual Activity   Alcohol use: Yes    Alcohol/week: 0.0 standard drinks of alcohol    Comment: socially   Drug use: No    Sexual activity: Not on file  Other Topics Concern   Not on file  Social History Narrative   The patient works as a Emergency planning/management officer.     She has been in the profession for 25 years.     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    She does not smoke cigarettes or use recreational drugs.     She drinks alcohol on rare occasions with 1 glass of wine. Approx. Once a month.  She does not do regular exercise.   recently    HH of 3 -4hysband and 80 yo son  Has exchange student from [place] now    Pet poodle   Husband is a smoker   [name] is married with 3 children, ages  One fr college another on her own       hh of 4    Social Drivers of Corporate investment banker Strain: Low Risk  (01/17/2024)   Overall Financial Resource Strain (CARDIA)    Difficulty of Paying Living Expenses: Not hard at all  Food Insecurity: No Food Insecurity (01/17/2024)   Hunger Vital Sign    Worried About Running Out of Food in the Last Year: Never true    Ran Out of Food in the Last Year: Never true  Transportation Needs: No Transportation Needs (01/17/2024)   PRAPARE - Administrator, Civil Service (Medical): No    Lack of Transportation (Non-Medical): No  Physical Activity: Sufficiently Active (01/17/2024)   Exercise Vital Sign    Days of Exercise per Week: 5 days    Minutes of Exercise per Session: 30 min  Stress: No Stress Concern Present (01/17/2024)   [employer] of Occupational Health - Occupational Stress Questionnaire    Feeling of Stress: Only a little  Social Connections: Socially Integrated (01/17/2024)   Social Connection and Isolation Panel    Frequency of Communication with Friends and Family: More than three times a week    Frequency of Social Gatherings with Friends and Family: Twice a week    Attends Religious Services: More than 4 times per year    Active Member of [employer] or Organizations: Yes    Attends Engineer, structural: More  than 4 times per year    Marital Status: Married    Outpatient Medications Prior to Visit  Medication Sig Dispense Refill   acetaminophen  (TYLENOL ) 500 MG tablet Take 2 tablets (  1,000 mg total) by mouth every 8 (eight) hours as needed for moderate pain. 30 tablet 0   albuterol  (VENTOLIN  HFA) 108 (90 Base) MCG/ACT inhaler USE 2 PUFFS EVERY 4 TO 6 HOURS AS NEEDED FOR SHORTNESS OF BREATH 18 g 0   benzonatate  (TESSALON ) 100 MG capsule Take 1-2 capsules (100-200 mg total) by mouth 3 (three) times daily as needed for cough. 24 capsule 0   CONCERTA 18 MG CR tablet Take 18 mg by mouth daily.     cyclobenzaprine  (FLEXERIL ) 10 MG tablet Take 1 tablet (10 mg total) by mouth at bedtime. 20 tablet 0   ibuprofen  (ADVIL ) 400 MG tablet Take 1 tablet (400 mg total) by mouth every 6 (six) hours as needed. 30 tablet 0   Minoxidil (ROGAINE MENS) 5 % FOAM See admin instructions.     MULTIPLE VITAMIN PO Take by mouth.     Multiple Vitamins-Minerals (HAIR/SKIN/NAILS) TABS Hair, Skin and Nails-Argan Oil     Omega-3 Fatty Acids (FISH OIL PO) Take 1 tablet by mouth daily.     predniSONE  (DELTASONE ) 50 MG tablet 1 by mouth each day for asthmatic bronchitis for 3-5 days 5 tablet 0   Probiotic Product (PROBIOTIC FORMULA PO) Take by mouth.     raloxifene (EVISTA) 60 MG tablet Take 60 mg by mouth daily.     Tafluprost, PF, (ZIOPTAN) 0.0015 % SOLN Zioptan (PF) 0.0015 % eye drops in a dropperette     timolol (TIMOPTIC) 0.5 % ophthalmic solution Place 1 drop into both eyes daily.     spironolactone (ALDACTONE) 100 MG tablet Take 100 mg by mouth daily.     No facility-administered medications prior to visit.     EXAM:  BP (!) 88/68 (BP Location: Right Arm, Patient Position: Sitting)   Pulse 71   Temp 97.7 F (36.5 C) (Oral)   Ht 5' 6.75 (1.695 m)   Wt 214 lb (97.1 kg)   SpO2 98%   BMI 33.77 kg/m   Body mass index is 33.77 kg/m. Wt Readings from Last 3 Encounters:  01/18/24 214 lb (97.1 kg)  01/07/23 203 lb  (92.1 kg)  12/31/21 208 lb 9.6 oz (94.6 kg)    Physical Exam: Vital signs reviewed here with young daughter  HZW:Uypd is a well-developed well-nourished alert cooperative    who appearsr stated age in no acute distress.  HEENT: normocephalic atraumatic , Eyes: PERRL EOM's full, conjunctiva clear, Nares: paten,t no deformity discharge or tenderness., Ears: no deformity EAC's clear TMs with normal landmarks. Mouth: clear OP, no lesions, edema.  Moist mucous membranes. Dentition in adequate repair. NECK: supple without masses, thyromegaly or bruits. CHEST/PULM:  Clear to auscultation and percussion breath sounds equal no wheeze , rales or rhonchi. No chest wall deformities or tenderness. Breast: normal by inspection . No dimpling, discharge, masses, tenderness or discharge . CV: PMI is nondisplaced, S1 S2 no gallops, murmurs, rubs. Peripheral pulses are full without delay.No JVD .  ABDOMEN: Bowel sounds normal nontender  No guard or rebound, no hepato splenomegal no CVA tenderness.   Extremtities:  No clubbing cyanosis or edema, no acute joint swelling or redness no focal atrophy NEURO:  Oriented x3, cranial nerves 3-12 appear to be intact, no obvious focal weakness,gait within normal limits no abnormal reflexes or asymmetrical SKIN: No acute rashes normal turgor, color, no bruising or petechiae. PSYCH: Oriented, good eye contact, no obvious depression anxiety, cognition and judgment appear normal. LN: no cervical axillary inguinal adenopathy  Lab Results  Component  Value Date   WBC 5.7 01/11/2024   HGB 13.7 01/11/2024   HCT 42.2 01/11/2024   PLT 258.0 01/11/2024   GLUCOSE 115 (H) 01/11/2024   CHOL 201 (H) 01/11/2024   TRIG 91.0 01/11/2024   HDL 42.80 01/11/2024   LDLDIRECT 137.9 10/10/2008   LDLCALC 140 (H) 01/11/2024   ALT 12 01/11/2024   AST 16 01/11/2024   NA 139 01/11/2024   K 4.2 01/11/2024   CL 103 01/11/2024   CREATININE 1.07 01/11/2024   BUN 14 01/11/2024   CO2 30  01/11/2024   TSH 2.14 01/11/2024   HGBA1C 6.3 01/11/2024   MICROALBUR 0.7 01/11/2024    BP Readings from Last 3 Encounters:  01/18/24 (!) 88/68  01/07/23 100/74  08/29/22 108/66    Lab results reviewed with patient   ASSESSMENT AND PLAN:  Discussed the following assessment and plan:    ICD-10-CM   1. Visit for preventive health examination  Z00.00     2. Hyperglycemia  R73.9     3. Hair loss disorder  L65.9    under derm care    Weight has picked up   in last year  prob multiple reasons  glucose intolerance  ast ct score 0  Ldl up some  prob related to ls Advise intensification of lsi  eats out  avoiding calori ed beverages etc  patient aware  adding resistance exercise .  Plan fu 6 mos with A1c.  We briefly disc use of glp1s also .   Update  prevnar  at 65  or as indicated  (uses albuterol  only when gets resp infection ) Covid vaccines as per patient preference . Spriomoloacton and topical minoxidil prob cause of low bp reading but she feels fine and able to exercise  ( med at night  as per derm) Return in about 6 months (around 07/20/2024) for hg a1c at visit .  Patient Care Team: Deundre Thong, Apolinar POUR, MD as PCP - General Rutherford Gain, MD (Obstetrics and Gynecology) Wonda Sharper, MD (Cardiology) Melodi Lerner, MD as Consulting Physician (Orthopedic Surgery) Axner, Laymon RIGGERS (Dermatology) Patient Instructions  Good to see you today .  Attention to Continue lifestyle intervention healthy eating and exercise .   Add resistance training exercise for muscles  Avoid calories in beverages  hydrate.SABRA   Optimize   what  you eat when eating out.   Plan 6 mos visit and we can do hg A1c at the visit to follow blood sugar .   Consider getting update pneumonia vaccine at age 44  prevnar 20 or equivalent   Keywon Mestre K. Tesla Keeler M.D.

## 2024-03-09 ENCOUNTER — Encounter: Payer: Self-pay | Admitting: Gastroenterology

## 2024-04-20 ENCOUNTER — Ambulatory Visit: Admitting: *Deleted

## 2024-04-20 ENCOUNTER — Encounter: Payer: Self-pay | Admitting: Gastroenterology

## 2024-04-20 VITALS — Ht 67.0 in | Wt 205.0 lb

## 2024-04-20 DIAGNOSIS — Z860100 Personal history of colon polyps, unspecified: Secondary | ICD-10-CM

## 2024-04-20 DIAGNOSIS — Z8371 Family history of adenomatous and serrated polyps: Secondary | ICD-10-CM

## 2024-04-20 MED ORDER — NA SULFATE-K SULFATE-MG SULF 17.5-3.13-1.6 GM/177ML PO SOLN: 1.0000 | Freq: Once | ORAL | 0 refills | Status: AC

## 2024-04-20 NOTE — Progress Notes (Addendum)
 Pt's name and DOB verified at the beginning of the pre-visit with 2 identifiers  Pt denies any difficulty with ambulating,sitting, laying down or rolling side to side  Pt has no issues moving head neck or swallowing  No egg or soy allergy known to patient   No issues known to pt with past sedation  No FH of Malignant Hyperthermia  Pt is not on home 02   Pt is not on blood thinners    Pt has frequent issues with constipation RN instructed pt to use Miralax per bottles instructions a week before prep days. Pt states they will  Pt is not on dialysis   Pt denies any upcoming cardiac testing   Chart not reviewed by CRNA prior to [facility]  Visit by phone  Pt states weight is   Pt given  both [facility] main # and MD on call # prior to instructions.  Informed pt to come in at the time discussed and is shown on PV instructions.  Pt instructed to use Singlecare.com or GoodRx for a price reduction on prep  Instructed pt where to find PV instructions in My Ch. Copy of instructions  to be sent in mail and address read back to pt to verify correct on envelope. Instructed pt on all aspects of written instructions including med holds clothing to wear and foods to eat and not eat as well as after procedure legal restrictions and to call MD on call if needed.. Pt states understanding. Instructed pt to review instructions again prior to procedure and call main # given if has any questions or any issues. Pt states they will.

## 2024-05-05 ENCOUNTER — Ambulatory Visit (AMBULATORY_SURGERY_CENTER): Admitting: Gastroenterology

## 2024-05-05 ENCOUNTER — Encounter: Payer: Self-pay | Admitting: Gastroenterology

## 2024-05-05 VITALS — BP 121/73 | HR 67 | Temp 97.9°F | Resp 14 | Ht 67.0 in | Wt 205.0 lb

## 2024-05-05 DIAGNOSIS — Z860100 Personal history of colon polyps, unspecified: Secondary | ICD-10-CM

## 2024-05-05 DIAGNOSIS — K635 Polyp of colon: Secondary | ICD-10-CM | POA: Diagnosis not present

## 2024-05-05 DIAGNOSIS — K644 Residual hemorrhoidal skin tags: Secondary | ICD-10-CM | POA: Diagnosis not present

## 2024-05-05 DIAGNOSIS — Z1211 Encounter for screening for malignant neoplasm of colon: Secondary | ICD-10-CM

## 2024-05-05 DIAGNOSIS — D125 Benign neoplasm of sigmoid colon: Secondary | ICD-10-CM

## 2024-05-05 DIAGNOSIS — D128 Benign neoplasm of rectum: Secondary | ICD-10-CM | POA: Diagnosis not present

## 2024-05-05 DIAGNOSIS — K648 Other hemorrhoids: Secondary | ICD-10-CM | POA: Diagnosis not present

## 2024-05-05 DIAGNOSIS — Z8371 Family history of adenomatous and serrated polyps: Secondary | ICD-10-CM

## 2024-05-05 MED ORDER — SODIUM CHLORIDE 0.9 % IV SOLN: 500.0000 mL | Freq: Once | INTRAVENOUS | Status: DC

## 2024-05-05 NOTE — Op Note (Signed)
 [facility] Patient Name: Hannah [name] Procedure Date: 05/05/2024 10:53 AM MRN: [id number] Endoscopist: Gustav ALONSO Mcgee , MD, [id number] Age: 63 Referring MD:  Date of Birth: 1960-07-10 Gender: Female Account #: [id number] Procedure:                Colonoscopy Indications:              Colon cancer screening in patient with 1st-degree                            relative having advanced adenoma of the colon,                            Colon cancer screening in patient with 1st-degree                            relative having traditional serrated adenoma of the                            colon, High risk colon cancer surveillance:                            Personal history of adenoma less than 10 mm in size Medicines:                Monitored Anesthesia Care Procedure:                Pre-Anesthesia Assessment:                           - Prior to the procedure, a History and Physical                            was performed, and patient medications and                            allergies were reviewed. The patient's tolerance of                            previous anesthesia was also reviewed. The risks                            and benefits of the procedure and the sedation                            options and risks were discussed with the patient.                            All questions were answered, and informed consent                            was obtained. Prior Anticoagulants: The patient has                            taken no anticoagulant or antiplatelet agents. ASA  Grade Assessment: II - A patient with mild systemic                            disease. After reviewing the risks and benefits,                            the patient was deemed in satisfactory condition to                            undergo the procedure.                           After obtaining informed consent, the colonoscope                             was passed under direct vision. Throughout the                            procedure, the patient's blood pressure, pulse, and                            oxygen saturations were monitored continuously. The                            [id number]-HQ190L Colonoscope [id number] was introduced                            through the anus and advanced to the the cecum,                            identified by appendiceal orifice and ileocecal                            valve. The colonoscopy was performed without                            difficulty. The patient tolerated the procedure                            well. The quality of the bowel preparation was                            good. The ileocecal valve, appendiceal orifice, and                            rectum were photographed. Scope In: 11:04:27 AM Scope Out: 11:28:31 AM Scope Withdrawal Time: 0 hours 11 minutes 36 seconds  Total Procedure Duration: 0 hours 24 minutes 4 seconds  Findings:                 The perianal and digital rectal examinations were                            normal.  Five sessile polyps were found in the rectum X 4                            and sigmoid colon X 1. The polyps were 4 to 10 mm                            in size. These polyps were removed with a cold                            snare. Resection and retrieval were complete.                           Non-bleeding external and internal hemorrhoids were                            found during retroflexion. The hemorrhoids were                            medium-sized. Complications:            No immediate complications. Estimated Blood Loss:     Estimated blood loss was minimal. Impression:               - Five 4 to 10 mm polyps in the rectum and in the                            sigmoid colon, removed with a cold snare. Resected                            and retrieved.                           - Non-bleeding external and internal  hemorrhoids. Recommendation:           - Patient has a contact number available for                            emergencies. The signs and symptoms of potential                            delayed complications were discussed with the                            patient. Return to normal activities tomorrow.                            Written discharge instructions were provided to the                            patient.                           - Resume previous diet.                           -  Continue present medications.                           - Await pathology results.                           - Repeat colonoscopy in 3 - 5 years for                            surveillance based on pathology results. Jakara Blatter V. Brooklynne Pereida, MD 05/05/2024 11:33:26 AM This report has been signed electronically.

## 2024-05-05 NOTE — Patient Instructions (Signed)
Handouts provided on polyps and hemorrhoids.   Resume previous diet. Continue present medications.  Await pathology results.  Repeat colonoscopy in 3-5 years for surveillance based on pathology results.   YOU HAD AN ENDOSCOPIC PROCEDURE TODAY AT [facility]:   Refer to the procedure report that was given to you for any specific questions about what was found during the examination.  If the procedure report does not answer your questions, please call your gastroenterologist to clarify.  If you requested that your care partner not be given the details of your procedure findings, then the procedure report has been included in a sealed envelope for you to review at your convenience later.  YOU SHOULD EXPECT: Some feelings of bloating in the abdomen. Passage of more gas than usual.  Walking can help get rid of the air that was put into your GI tract during the procedure and reduce the bloating. If you had a lower endoscopy (such as a colonoscopy or flexible sigmoidoscopy) you may notice spotting of blood in your stool or on the toilet paper. If you underwent a bowel prep for your procedure, you may not have a normal bowel movement for a few days.  Please Note:  You might notice some irritation and congestion in your nose or some drainage.  This is from the oxygen used during your procedure.  There is no need for concern and it should clear up in a day or so.  SYMPTOMS TO REPORT IMMEDIATELY:  Following lower endoscopy (colonoscopy or flexible sigmoidoscopy):  Excessive amounts of blood in the stool  Significant tenderness or worsening of abdominal pains  Swelling of the abdomen that is new, acute  Fever of 100F or higher  For urgent or emergent issues, a gastroenterologist can be reached at any hour by calling [phone]. Do not use MyChart messaging for urgent concerns.    DIET:  We do recommend a small meal at first, but then you may proceed to your regular diet.  Drink  plenty of fluids but you should avoid alcoholic beverages for 24 hours.  ACTIVITY:  You should plan to take it easy for the rest of today and you should NOT DRIVE or use heavy machinery until tomorrow (because of the sedation medicines used during the test).    FOLLOW UP: Our staff will call the number listed on your records the next business day following your procedure.  We will call around 7:15- 8:00 am to check on you and address any questions or concerns that you may have regarding the information given to you following your procedure. If we do not reach you, we will leave a message.     If any biopsies were taken you will be contacted by phone or by letter within the next 1-3 weeks.  Please call us at [phone] if you have not heard about the biopsies in 3 weeks.    SIGNATURES/CONFIDENTIALITY: You and/or your care partner have signed paperwork which will be entered into your electronic medical record.  These signatures attest to the fact that that the information above on your After Visit Summary has been reviewed and is understood.  Full responsibility of the confidentiality of this discharge information lies with you and/or your care-partner.

## 2024-05-05 NOTE — Progress Notes (Signed)
 [facility] History and Physical   Primary Care Physician:  Charlett Apolinar POUR, MD   Reason for Procedure:  History of adenomatous colon polyps, family h/o advance colon polyps  Plan:    Surveillance colonoscopy with possible interventions as needed     HPI: Hannah [name] is a very pleasant 63 y.o. female here for surveillance colonoscopy. Denies any nausea, vomiting, abdominal pain, melena or bright red blood per rectum  The risks and benefits as well as alternatives of endoscopic procedure(s) have been discussed and reviewed.  The patient was provided an opportunity to ask questions and all were answered. The patient agreed with the plan and demonstrated an understanding of the instructions.   Past Medical History:  Diagnosis Date   Asthma    with MDI   Chest pain, atypical    CHEST PAIN-PRECORDIAL 10/26/2008   Qualifier: Diagnosis of  By: Wonda, MD, Ozell Lenis    Chronic constipation    Constipation    Endometrial cancer (HCC) 2011   H/O: hysterectomy 06/22/2009   presented with irregular bleeding   Iron deficiency anemia    MENOPAUSE, SURGICAL 01/06/2010   Qualifier: Diagnosis of  By: Charlett MD, Apolinar POUR    NEOPLASM, MALIGNANT, UTERUS, HX OF 01/06/2010   Qualifier: Diagnosis of  By: Charlett MD, Apolinar POUR    Nevus, atypical    Osteochondroma    OSTEOCHONDROMA 10/10/2008   Qualifier: Diagnosis of  By: Joshua CMA, Chemira     Rhinitis    Seasonal allergies    Uterine cancer (HCC) 2011    Past Surgical History:  Procedure Laterality Date   CESAREAN SECTION     COLONOSCOPY  2014   DB-MAC-mov(fair)-normal redundant/tort colon -couldnt view cecal pouch-7-10 yr recall   FOOT SURGERY Bilateral    MENISCUS REPAIR Left 01/2019   MYOMECTOMY     SHOULDER ARTHROSCOPY WITH OPEN ROTATOR CUFF REPAIR Left    TOTAL KNEE ARTHROPLASTY Left 2022   TOTAL VAGINAL HYSTERECTOMY  2011   TUBAL LIGATION     UMBILICAL HERNIA REPAIR     WISDOM TOOTH EXTRACTION       Prior to Admission medications   Medication Sig Start Date End Date Taking? Authorizing Provider  polyethylene glycol powder (GLYCOLAX/MIRALAX) 17 GM/SCOOP powder Take 17 g by mouth daily. Dissolve 1 capful (17g) in 4-8 ounces of liquid and take by mouth daily.   Yes [provider]  raloxifene (EVISTA) 60 MG tablet Take 60 mg by mouth daily.   Yes [provider]  RALOXIFENE HCL PO daily at 12 noon.   Yes [provider]  acetaminophen  (TYLENOL ) 500 MG tablet Take 2 tablets (1,000 mg total) by mouth every 8 (eight) hours as needed for moderate pain. 08/29/22   Jerral Meth, MD  albuterol  (VENTOLIN  HFA) 108 (90 Base) MCG/ACT inhaler USE 2 PUFFS EVERY 4 TO 6 HOURS AS NEEDED FOR SHORTNESS OF BREATH Patient taking differently: as needed. USE 2 PUFFS EVERY 4 TO 6 HOURS AS NEEDED FOR SHORTNESS OF BREATH 11/05/23   Panosh, Apolinar POUR, MD  benzonatate  (TESSALON ) 100 MG capsule Take 1-2 capsules (100-200 mg total) by mouth 3 (three) times daily as needed for cough. 11/09/23   Panosh, Wanda K, MD  CONCERTA 18 MG CR tablet Take 18 mg by mouth daily. 09/07/19   [provider]  cyclobenzaprine  (FLEXERIL ) 10 MG tablet Take 1 tablet (10 mg total) by mouth at bedtime. 08/29/22   Jerral Meth, MD  ibuprofen  (ADVIL ) 400 MG tablet Take 1  tablet (400 mg total) by mouth every 6 (six) hours as needed. 08/29/22   Countryman, Chase, MD  Minoxidil (ROGAINE MENS) 5 % FOAM See admin instructions.    [provider]  minoxidil (ROGAINE) 2 % external solution Apply topically 2 (two) times daily.    [provider]  MULTIPLE VITAMIN PO Take by mouth.    [provider]  Multiple Vitamins-Minerals (HAIR/SKIN/NAILS) TABS Hair, Skin and Nails-Argan Oil    [provider]  Omega-3 Fatty Acids (FISH OIL PO) Take 1 tablet by mouth daily.    [provider]  predniSONE  (DELTASONE ) 50 MG tablet 1 by mouth each day for asthmatic bronchitis for 3-5 days  11/09/23   Panosh, Wanda K, MD  Probiotic Product (PROBIOTIC FORMULA PO) Take by mouth.    [provider]  spironolactone (ALDACTONE) 100 MG tablet Take 100 mg by mouth daily.    [provider]  Tafluprost, PF, (ZIOPTAN) 0.0015 % SOLN Zioptan (PF) 0.0015 % eye drops in a dropperette Patient not taking: Reported on 05/05/2024    [provider]  Tafluprost, PF, (ZIOPTAN) 0.0015 % SOLN Apply 1 drop to eye daily at 12 noon. Patient not taking: Reported on 05/05/2024 09/21/19   [provider]  timolol (TIMOPTIC) 0.5 % ophthalmic solution Place 1 drop into both eyes daily. 01/21/21   [provider]    Current Outpatient Medications  Medication Sig Dispense Refill   polyethylene glycol powder (GLYCOLAX/MIRALAX) 17 GM/SCOOP powder Take 17 g by mouth daily. Dissolve 1 capful (17g) in 4-8 ounces of liquid and take by mouth daily.     raloxifene (EVISTA) 60 MG tablet Take 60 mg by mouth daily.     RALOXIFENE HCL PO daily at 12 noon.     acetaminophen  (TYLENOL ) 500 MG tablet Take 2 tablets (1,000 mg total) by mouth every 8 (eight) hours as needed for moderate pain. 30 tablet 0   albuterol  (VENTOLIN  HFA) 108 (90 Base) MCG/ACT inhaler USE 2 PUFFS EVERY 4 TO 6 HOURS AS NEEDED FOR SHORTNESS OF BREATH (Patient taking differently: as needed. USE 2 PUFFS EVERY 4 TO 6 HOURS AS NEEDED FOR SHORTNESS OF BREATH) 18 g 0   benzonatate  (TESSALON ) 100 MG capsule Take 1-2 capsules (100-200 mg total) by mouth 3 (three) times daily as needed for cough. 24 capsule 0   CONCERTA 18 MG CR tablet Take 18 mg by mouth daily.     cyclobenzaprine  (FLEXERIL ) 10 MG tablet Take 1 tablet (10 mg total) by mouth at bedtime. 20 tablet 0   ibuprofen  (ADVIL ) 400 MG tablet Take 1 tablet (400 mg total) by mouth every 6 (six) hours as needed. 30 tablet 0   Minoxidil (ROGAINE MENS) 5 % FOAM See admin instructions.     minoxidil (ROGAINE) 2 % external solution Apply topically 2 (two) times daily.      MULTIPLE VITAMIN PO Take by mouth.     Multiple Vitamins-Minerals (HAIR/SKIN/NAILS) TABS Hair, Skin and Nails-Argan Oil     Omega-3 Fatty Acids (FISH OIL PO) Take 1 tablet by mouth daily.     predniSONE  (DELTASONE ) 50 MG tablet 1 by mouth each day for asthmatic bronchitis for 3-5 days 5 tablet 0   Probiotic Product (PROBIOTIC FORMULA PO) Take by mouth.     spironolactone (ALDACTONE) 100 MG tablet Take 100 mg by mouth daily.     Tafluprost, PF, (ZIOPTAN) 0.0015 % SOLN Zioptan (PF) 0.0015 % eye drops in a dropperette (Patient not taking: Reported on  05/05/2024)     Tafluprost, PF, (ZIOPTAN) 0.0015 % SOLN Apply 1 drop to eye daily at 12 noon. (Patient not taking: Reported on 05/05/2024)     timolol (TIMOPTIC) 0.5 % ophthalmic solution Place 1 drop into both eyes daily.     Current Facility-Administered Medications  Medication Dose Route Frequency Provider Last Rate Last Admin   0.9 %  sodium chloride  infusion  500 mL Intravenous Once Jeannetta Cerutti V, MD        Allergies as of 05/05/2024   (No Known Allergies)    Family History  Problem Relation Age of Onset   Hypertension Mother    Alcohol abuse Mother    Diabetes Father        died from complications of dm, amputation and sepsis in his 60's    Colon polyps Father    Hypertension Sister    Colon polyps Sister    Breast cancer Maternal Aunt    Colon polyps Other    Colon cancer Neg Hx    Esophageal cancer Neg Hx    Pancreatic cancer Neg Hx    Liver disease Neg Hx    Rectal cancer Neg Hx    Stomach cancer Neg Hx     Social History   Socioeconomic History   Marital status: Married    Spouse name: Not on file   Number of children: 3   Years of education: Not on file   Highest education level: Master's degree (e.g., MA, MS, MEng, MEd, MSW, MBA)  Occupational History   Occupation: Emergency planning/management officer  Tobacco Use   Smoking status: Never   Smokeless tobacco: Never  Vaping Use   Vaping status: Never Used  Substance and Sexual  Activity   Alcohol use: Yes    Alcohol/week: 0.0 standard drinks of alcohol    Comment: socially   Drug use: No   Sexual activity: Not on file  Other Topics Concern   Not on file  Social History Narrative   The patient works as a emergency planning/management officer.     She has been in the profession for 25 years.     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    She does not smoke cigarettes or use recreational drugs.     She drinks alcohol on rare occasions with 1 glass of wine. Approx. Once a month.  She does not do regular exercise.   recently    HH of 3 -4hysband and 53 yo son  Has exchange student from [place] now    Pet poodle   Husband is a smoker   [name] is married with 3 children, ages  One fr college another on her own       hh of 4    Social Drivers of Corporate Investment Banker Strain: Low Risk  (01/17/2024)   Overall Financial Resource Strain (CARDIA)    Difficulty of Paying Living Expenses: Not hard at all  Food Insecurity: No Food Insecurity (01/17/2024)   Hunger Vital Sign    Worried About Running Out of Food in the Last Year: Never true    Ran Out of Food in the Last Year: Never true  Transportation Needs: No Transportation Needs (01/17/2024)   PRAPARE - Administrator, Civil Service (Medical): No    Lack of Transportation (Non-Medical): No  Physical Activity: Sufficiently Active (01/17/2024)   Exercise Vital Sign    Days of Exercise per Week: 5 days  Minutes of Exercise per Session: 30 min  Stress: No Stress Concern Present (01/17/2024)   [employer] of Occupational Health - Occupational Stress Questionnaire    Feeling of Stress: Only a little  Social Connections: Socially Integrated (01/17/2024)   Social Connection and Isolation Panel    Frequency of Communication with Friends and Family: More than three times a week    Frequency of Social Gatherings with Friends and Family: Twice a week    Attends Religious Services: More  than 4 times per year    Active Member of [employer] or Organizations: Yes    Attends Engineer, Structural: More than 4 times per year    Marital Status: Married  Catering Manager Violence: Not At Risk (01/07/2023)   Humiliation, Afraid, Rape, and Kick questionnaire    Fear of Current or Ex-Partner: No    Emotionally Abused: No    Physically Abused: No    Sexually Abused: No    Review of Systems:  All other review of systems negative except as mentioned in the HPI.  Physical Exam: Vital signs in last 24 hours: BP 122/71   Pulse 69   Temp 97.9 F (36.6 C) (Temporal)   Ht 5' 7 (1.702 m)   Wt 205 lb (93 kg)   SpO2 99%   BMI 32.11 kg/m  General:   Alert, NAD Lungs:  Clear .   Heart:  Regular rate and rhythm Abdomen:  Soft, nontender and nondistended. Neuro/Psych:  Alert and cooperative. Normal mood and affect. A and O x 3  Reviewed labs, radiology imaging, old records and pertinent past GI work up  Patient is appropriate for planned procedure(s) and anesthesia in an ambulatory setting   K. Veena Shahab Polhamus , MD [phone]

## 2024-05-05 NOTE — Progress Notes (Signed)
 Called to room to assist during endoscopic procedure.  Patient ID and intended procedure confirmed with present staff. Received instructions for my participation in the procedure from the performing physician.

## 2024-05-05 NOTE — Progress Notes (Signed)
 Pt's states no medical or surgical changes since previsit or office visit.

## 2024-05-05 NOTE — Progress Notes (Signed)
 Report to PACU, RN, vss, BBS= Clear.

## 2024-05-08 ENCOUNTER — Telehealth: Payer: Self-pay

## 2024-05-08 NOTE — Telephone Encounter (Signed)
  Follow up Call-     05/05/2024   10:00 AM  Call back number  Post procedure Call Back phone  # [phone]  Permission to leave phone message Yes     Patient questions:  Do you have a fever, pain , or abdominal swelling? No. Pain Score  0 *  Have you tolerated food without any problems? Yes.    Have you been able to return to your normal activities? Yes.    Do you have any questions about your discharge instructions: Diet   No. Medications  No. Follow up visit  No.  Do you have questions or concerns about your Care? No.  Actions: * If pain score is 4 or above: No action needed, pain <4.

## 2024-05-09 LAB — SURGICAL PATHOLOGY

## 2024-06-07 ENCOUNTER — Ambulatory Visit: Payer: Self-pay | Admitting: Gastroenterology

## 2024-07-20 ENCOUNTER — Ambulatory Visit: Admitting: Internal Medicine

## 2025-01-18 ENCOUNTER — Encounter: Admitting: Internal Medicine
